# Patient Record
Sex: Male | Born: 1945 | Hispanic: No | Marital: Married | Smoking: Former smoker
Health system: Southern US, Community
[De-identification: ages and names within clinical notes are randomized; demographics above are authoritative.]

## PROBLEM LIST (undated history)

## (undated) DIAGNOSIS — I4891 Unspecified atrial fibrillation: Secondary | ICD-10-CM

---

## 2003-07-27 ENCOUNTER — Encounter: Payer: Self-pay | Admitting: Specialist

## 2003-08-02 ENCOUNTER — Observation Stay (HOSPITAL_COMMUNITY): Admission: RE | Admit: 2003-08-02 | Discharge: 2003-08-03 | Payer: Self-pay | Admitting: Specialist

## 2004-09-15 ENCOUNTER — Ambulatory Visit: Payer: Self-pay | Admitting: Cardiology

## 2004-09-18 ENCOUNTER — Ambulatory Visit: Payer: Self-pay

## 2004-11-21 ENCOUNTER — Ambulatory Visit: Payer: Self-pay | Admitting: Internal Medicine

## 2004-11-27 ENCOUNTER — Ambulatory Visit: Payer: Self-pay | Admitting: Cardiology

## 2007-06-17 DIAGNOSIS — I10 Essential (primary) hypertension: Secondary | ICD-10-CM

## 2007-06-17 DIAGNOSIS — M545 Low back pain, unspecified: Secondary | ICD-10-CM | POA: Insufficient documentation

## 2007-06-17 DIAGNOSIS — I4891 Unspecified atrial fibrillation: Secondary | ICD-10-CM

## 2007-06-17 DIAGNOSIS — Z8679 Personal history of other diseases of the circulatory system: Secondary | ICD-10-CM | POA: Insufficient documentation

## 2009-05-08 ENCOUNTER — Emergency Department (HOSPITAL_BASED_OUTPATIENT_CLINIC_OR_DEPARTMENT_OTHER): Admission: EM | Admit: 2009-05-08 | Discharge: 2009-05-08 | Payer: Self-pay | Admitting: Emergency Medicine

## 2009-05-08 ENCOUNTER — Ambulatory Visit: Payer: Self-pay | Admitting: Radiology

## 2011-03-20 NOTE — Op Note (Signed)
NAME:  Andre Friedman, Andre Friedman                           ACCOUNT NO.:  [id number]   MEDICAL RECORD NO.:  [id number]                   PATIENT TYPE:  AMB   LOCATION:  DAY                                  FACILITY:  [facility]   PHYSICIAN:  Kerrin Champagne, M.D.                DATE OF BIRTH:  Jan 17, 1946   DATE OF PROCEDURE:  08/02/2003  DATE OF DISCHARGE:                                 OPERATIVE REPORT   PREOPERATIVE DIAGNOSIS:  Left shoulder rotator cuff tear with retraction.   POSTOPERATIVE DIAGNOSIS:  Massive left shoulder rotator cuff tear with  retraction.   PROCEDURE:  Left shoulder acromioplasty with repair of left shoulder rotator  cuff tear; using an allograft fascia lata graft to the cuff, anchored  distally with four Mitek anchors.   SURGEON:  Kerrin Champagne, M.D.   ASSISTANT:  Wende Neighbors, P.A.   ANESTHESIA:  GOT by Hezzie Bump. Rose, M.D.   ESTIMATED BLOOD LOSS:  50 cc.   DRAINS:  None.   BRIEF CLINICAL NOTE:  This patient, a 65 year old male, who presented to  [facility] with the chief complaint of increased left arm weakness.  He has stated  that it is being worsened when he was trying to lift a bag of garbage and  throw it.  He states that this injury occurred in August 2004.  He has had  previous problems with the left shoulder, dating back to two years ago.  He  presented with significant left arm drop, weakness in abduction, external  rotation and internal rotation of the left shoulder.  His AC joint was  nontender.  Radiographs demonstrating narrowed left subacromial space with  degenerative change of the [facility] joint.  The patient underwent an MRI scan,  which demonstrated complete rotator cuff tear with some retraction present.  The patient's subscapularis apparently intact, as was his biceps tendon.  Because of persistent weakness, difficulty with overhead use of the arm, he  is taken to the operating room to undergo a left shoulder rotator cuff  repair.   INTRAOPERATIVE  FINDINGS:  A massive left shoulder rotator cuff tear,  involving the supraspinatus and infraspinatus muscles.  Despite attempts at  retraction, the infraspinatus and supraspinatus tendons could only be pulled  back only a centimeter or so distal to the edge of the labrum.  Therefore,  tendon graft was undertaken using fascia lata allograft material (6 cm x 16  cm).  This was folded onto itself four times and then used to graft the  rotator cuff.   DESCRIPTION OF PROCEDURE:  After adequate general anesthesia, the patient in  a beach chair position, standard prep done with Duraprep solution of left  wrist to the left axillary region, periscapular area and base of the neck  and anterior chest.  Draped in the usual manner.  Iodine Vy-Drape was used.   The incision was then approached  to the anterior aspect of the shoulder,  through an approximately 6 cm incision; initially a  3 or 4 cm incision, but this was extended once it was determined that a  graft procedure was necessary.  The incision over the acromion process  proximally, then in line with the anterior one-third raphe of the deltoid  muscle.  The skin and subcutaneous layers, the periosteum overlying the  acromion were then incised in line with the anterior raffae of the deltoid.  Then subperiosteal dissection carried both medially and laterally, exposing  the anterior aspect of the acromion process; there was a very large  hypertrophic spur here.  This was then divided using an oscillating saw.  A  high-speed bur then used to debride the undersurface of the acromion  process, decompressing the shoulder joint nicely.  Attempts were made to  completely decompress the lateral portion of the acromion, as this appeared  to be dipping downwards and causing further subacromial compression of the  humeral head.   A self-retaining retractor placed.  Careful inspection demonstrated that the  supraspinatus and infraspinatus tendons had  completely ruptured off of the  greater tuberosity.  There were only a few small strands remaining on the  greater tuberosity.  The remaining portion of the cuff was retracted, such  that with traction on the arm examination of the posterior aspect of the  shoulder joint demonstrated the edges of the supraspinatus and infraspinatus  tendons.  These were placed under traction using Kocher clamps, Allis clamps  and then sutures.  Sutures tended to tear away with attempts at prolonged  traction of this area.  The tendon material appeared to be poor in quality.  Biceps tendon was intact, as was the subscapularis anteriorly.   Next, 0 Ethibond sutures were used to tag the edges of the infraspinatus and  supraspinatus tendons, as they were localized from anterolateral, along the  posterior surface of the humeral head, over the top of the glenohumeral  joint; and then, sutures were also placed into the biceps tendon, then run  through the subscapularis muscle belly anteriorly and then brought through  the biceps tendon in multiple interrupted fashion.  A portion of allograft  tensor fascia lata graft then folded on itself multiple times, so that a  thickness of four thicknesses was obtained.  This was then sutured down to  the leading edges of the subscapularis and biceps anteriorly, and  supraspinatus and infraspinatus posteriorly; providing excellent fixation of  these areas.  The leading end was then cut at the level of the greater  tuberosity, and folded or embrocated underneath itself.  Mitek anchors were  then placed into the greater tuberosity; and the edge of the lateral aspect  of the chondral border of the humeral head and the sulcus were then  carefully burred using high-speed bur to bleeding cancellous bone.  Mitek  anchors were then used to suture the leading edge of the fascia lata graft  down, providing a nice suture repair of the leading edge of the graft to cancellous bone  surface.  Irrigation was then performed.  Range of motion  obtained of the shoulder, demonstrating no further impingement along the  graft or the rotator cuff itself.   A high-speed bur was used to further debride the undersurface of the  acromion process posteriorly, where a little bit of irregularity and  prominence of bone was found to be present.  When this was completed  irrigation was performed.  Then  the incision was closed by approximating the  periosteal layers of the deltoid over the superior aspect of the acromion  process, using 0 Vicryl sutures.  Reapproximating the superficial fascial  areas of the deltoid using interrupted 0 Vicryl sutures, deep subcutaneous  layers were approximated with interrupted 2-0 Vicryl sutures, and the skin  closed with a running subcuticular stitch of 4-0 Vicryl.  Tincture of  Benzoin and Steri-Strips applied.  Then 4x4's affixed to the skin with  Hypafix tape.  The skin was infiltrated then with Marcaine 0.5% with  1:200,000 epinephrine; and 6 cc of Marcaine with epinephrine injected into  the subacromial space.  The patient was then placed into a shoulder  immobilizer, an ABD anterior in the axillary region.  Then 4x4's and ABD  pads affixed to the skin with Hypafix tape over the left shoulder.   The patient was then reactivated, extubated and returned to the recovery  room in satisfactory condition.  All instrument and sponge counts were  correct.                                                Kerrin Champagne, M.D.   Myra Rude  D:  08/02/2003  T:  08/02/2003  Job:  [id number]

## 2015-10-13 ENCOUNTER — Emergency Department (HOSPITAL_COMMUNITY)
Admission: EM | Admit: 2015-10-13 | Discharge: 2015-10-13 | Disposition: A | Discharge status: Home / Self Care | Payer: 59 | Attending: Emergency Medicine | Admitting: Emergency Medicine

## 2015-10-13 ENCOUNTER — Encounter (HOSPITAL_COMMUNITY): Payer: Self-pay | Admitting: Emergency Medicine

## 2015-10-13 ENCOUNTER — Emergency Department (HOSPITAL_COMMUNITY): Payer: 59

## 2015-10-13 DIAGNOSIS — Z880 Allergy status to penicillin: Secondary | ICD-10-CM | POA: Diagnosis not present

## 2015-10-13 DIAGNOSIS — R079 Chest pain, unspecified: Secondary | ICD-10-CM | POA: Diagnosis present

## 2015-10-13 DIAGNOSIS — I480 Paroxysmal atrial fibrillation: Secondary | ICD-10-CM | POA: Insufficient documentation

## 2015-10-13 HISTORY — DX: Unspecified atrial fibrillation: I48.91

## 2015-10-13 LAB — TROPONIN I

## 2015-10-13 LAB — BASIC METABOLIC PANEL
ANION GAP: 4 — AB (ref 5–15)
BUN: 25 mg/dL — ABNORMAL HIGH (ref 6–20)
CALCIUM: 8.6 mg/dL — AB (ref 8.9–10.3)
CO2: 23 mmol/L (ref 22–32)
Chloride: 112 mmol/L — ABNORMAL HIGH (ref 101–111)
Creatinine, Ser: 1.21 mg/dL (ref 0.61–1.24)
GLUCOSE: 138 mg/dL — AB (ref 65–99)
POTASSIUM: 3.9 mmol/L (ref 3.5–5.1)
Sodium: 139 mmol/L (ref 135–145)

## 2015-10-13 LAB — CBC
HEMATOCRIT: 43.6 % (ref 39.0–52.0)
HEMOGLOBIN: 14.9 g/dL (ref 13.0–17.0)
MCH: 29.9 pg (ref 26.0–34.0)
MCHC: 34.2 g/dL (ref 30.0–36.0)
MCV: 87.6 fL (ref 78.0–100.0)
Platelets: 158 10*3/uL (ref 150–400)
RBC: 4.98 MIL/uL (ref 4.22–5.81)
RDW: 13.4 % (ref 11.5–15.5)
WBC: 10.1 10*3/uL (ref 4.0–10.5)

## 2015-10-13 NOTE — ED Provider Notes (Signed)
CSN: [id number]     Arrival date & time 10/13/15  1158 History   First MD Initiated Contact with Patient 10/13/15 1246     Chief Complaint  Patient presents with  . Chest Pain     Patient is a 69 y.o. male presenting with chest pain. The history is provided by the patient. No language interpreter was used.  Chest Pain  Andre Friedman is a 69 y.o. male who presents to the Emergency Department complaining of irregular heartbeat. He developed symptoms about one hour prior to ED arrival just after he ate breakfast. He developed a sensation of rapid heartbeat and irregular heartbeat. The rate was too high to count. He had some slight associated chest discomfort. He denies any fevers, vomiting, shortness of breath, abdominal pain, leg swelling or pain. On the time of ED evaluation his symptoms have completely resolved. He has a history of atrial fibrillation, last episode was a very long time ago. He also has a history of hypertension and takes metoprolol and lisinopril, no missed doses or recent changes to those doses. He is not on any anticoagulants.  Past Medical History  Diagnosis Date  . Atrial fibrillation (HCC)    History reviewed. No pertinent past surgical history. History reviewed. No pertinent family history. Social History  Substance Use Topics  . Smoking status: Never Smoker   . Smokeless tobacco: None  . Alcohol Use: No    Review of Systems  Cardiovascular: Positive for chest pain.  All other systems reviewed and are negative.     Allergies  Penicillins  Home Medications   Prior to Admission medications   Not on File   BP 144/106 mmHg  Pulse 125  Temp(Src) 98.2 F (36.8 C) (Oral)  Resp 18  SpO2 98% Physical Exam  Constitutional: He is oriented to person, place, and time. He appears well-developed and well-nourished.  HENT:  Head: Normocephalic and atraumatic.  Cardiovascular: Normal rate and regular rhythm.   No murmur heard. Pulmonary/Chest: Effort normal  and breath sounds normal. No respiratory distress.  Abdominal: Soft. There is no tenderness. There is no rebound and no guarding.  Musculoskeletal: He exhibits no edema or tenderness.  Neurological: He is alert and oriented to person, place, and time.  Skin: Skin is warm and dry.  Psychiatric: He has a normal mood and affect. His behavior is normal.  Nursing note and vitals reviewed.   ED Course  Procedures (including critical care time) Labs Review Labs Reviewed  BASIC METABOLIC PANEL - Abnormal; Notable for the following:    Chloride 112 (*)    Glucose, Bld 138 (*)    BUN 25 (*)    Calcium 8.6 (*)    Anion gap 4 (*)    All other components within normal limits  CBC  TROPONIN I    Imaging Review Dg Chest 2 View  10/13/2015  CLINICAL DATA:  Tachycardia.  History of atrial fibrillation EXAM: CHEST  2 VIEW COMPARISON:  July 29, 2012 FINDINGS: There is slight scarring in the left base. There is no edema or consolidation. Heart size and pulmonary vascularity are normal. No adenopathy. There is degenerative change in the thoracic spine. There is postoperative change in the left shoulder region. IMPRESSION: Mild scarring left base.  No edema or consolidation. Electronically Signed   By: Bretta BangWilliam  Woodruff III M.D.   On: 10/13/2015 14:12   I have personally reviewed and evaluated these images and lab results as part of my medical decision-making.  EKG Interpretation   Date/Time:  Sunday October 13 2015 12:05:20 EST Ventricular Rate:  113 PR Interval:    QRS Duration: 96 QT Interval:  341 QTC Calculation: 467 R Axis:   53 Text Interpretation:  Atrial fibrillation Paired ventricular premature  complexes Baseline wander in lead(s) V3 Confirmed by [name] [id number] on  10/13/2015 12:47:26 PM      MDM   Final diagnoses:  Paroxysmal atrial fibrillation (HCC)    Patient with history of atrial fibrillation here with symptomatic atrial fibrillation that resolved just  following ED arrival. Presentation is not consistent with ACS, PE, dissection. No significant electrolyte abnormality. Patient is currently on aspirin, no additional anticoagulants. Discussed with patient on careful paroxysmal atrial fibrillation, patient follow-up, return precautions.    Tilden Fossa, MD 10/13/15 1534

## 2015-10-13 NOTE — Discharge Instructions (Signed)

## 2015-10-13 NOTE — ED Notes (Signed)
Pt given d/c instructions, verbalized understanding. 

## 2015-10-13 NOTE — ED Notes (Signed)
Pt with Hx of atrial fibrillation c/o epigastric pain, rapid heart rate. Pt has atrial fibrillation which is controlled with medication, hasn't had a-fib episode in years.

## 2018-09-15 ENCOUNTER — Encounter: Payer: Self-pay | Admitting: Pulmonary Disease

## 2018-09-15 ENCOUNTER — Ambulatory Visit: Payer: Medicare Other | Admitting: Pulmonary Disease

## 2018-09-15 DIAGNOSIS — R0602 Shortness of breath: Secondary | ICD-10-CM | POA: Diagnosis not present

## 2018-09-15 NOTE — Progress Notes (Signed)
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 aspirin EC 81 MG tablet Take 162 mg by mouth at bedtime.  Marland Kitchen. atorvastatin (LIPITOR) 40 MG tablet Take 40 mg by mouth at bedtime.   . AZOPT 1 % ophthalmic suspension Place 1 drop into both eyes 2 (two) times daily.   . flecainide (TAMBOCOR) 50 MG tablet Take by mouth.  . metoprolol succinate (TOPROL-XL) 25 MG 24 hr tablet Take 25 mg by mouth at bedtime.   . ramipril (ALTACE) 10 MG capsule Take 10 mg by mouth at bedtime.   . TRAVATAN Z 0.004 % SOLN ophthalmic solution Place 1 drop into both eyes at bedtime.    No facility-administered encounter medications on file as of 09/15/2018.     Allergies as of 09/15/2018 - Review Complete 09/15/2018  Allergen Reaction Noted  . Penicillins Hives 06/17/2007     Past Medical History:  Diagnosis Date  . Atrial fibrillation (HCC)     No past surgical history on file.  No family history on file.  Social History   Socioeconomic History  . Marital status: Married    Spouse name: Not on file  . Number of children: Not on file  . Years of education: Not on file  . Highest education level: Not on file  Occupational History  . Not on file  Social Needs  . Financial resource strain: Not on file  . Food insecurity:    Worry: Not on file    Inability: Not on file  . Transportation needs:    Medical: Not on file    Non-medical: Not on file  Tobacco Use  . Smoking status: Former Smoker    Packs/day: 0.50    Types: Cigarettes    Start date: 05/13/1987    Last attempt to quit: 05/18/2000    Years since quitting: 18.3  . Smokeless tobacco: Never Used  Substance and Sexual Activity  . Alcohol use: No  . Drug use: No  . Sexual activity: Not on file  Lifestyle  . Physical activity:    Days per week: Not on file    Minutes per session: Not on file  . Stress: Not on file  Relationships  . Social connections:    Talks on phone: Not on file  Gets together: Not on file    Attends religious service: Not on file    Active member of club or organization: Not on file    Attends meetings of clubs or organizations: Not on file    Relationship status: Not on file  . Intimate partner violence:    Fear of current or ex partner: Not on file    Emotionally abused: Not on file    Physically abused: Not on file    Forced sexual activity: Not on file  Other Topics Concern  . Not on file  Social History Narrative  . Not on file    Review of Systems  Constitutional: Negative.   HENT: Negative.   Eyes: Negative.   Respiratory: Positive for cough and shortness of breath.   Cardiovascular: Negative for chest pain and leg swelling.  Endocrine: Negative.     Vitals:   09/15/18 0950  BP: (!) 140/98  Pulse: 66  SpO2: 100%     Physical  Exam  Constitutional: He is oriented to person, place, and time. He appears well-developed and well-nourished.  HENT:  Head: Normocephalic and atraumatic.  Eyes: Pupils are equal, round, and reactive to light. Right eye exhibits no discharge. Left eye exhibits no discharge.  Neck: Normal range of motion. Neck supple. No tracheal deviation present. No thyromegaly present.  Cardiovascular: Normal rate and regular rhythm.  Pulmonary/Chest: Effort normal and breath sounds normal. No respiratory distress. He has no wheezes. He has no rales.  Abdominal: Soft. Bowel sounds are normal. He exhibits no distension. There is no tenderness.  Musculoskeletal: Normal range of motion. He exhibits no edema or deformity.  Neurological: He is alert and oriented to person, place, and time.  Skin: Skin is warm and dry.  Psychiatric: He has a normal mood and affect.   Data Reviewed: Chest x-ray from 10/13/2015-mild scarring at the left base  Assessment:   History of significant exposure to asbestos in the past  1-1/2 years of work and asbestos directly without protective devices  Abnormal chest x-ray showing scarring at the left base  Shortness of breath with activity-this may be from multifactorial  Reformed smoker  History of atrial fibrillation  Plan/Recommendations:  We will order a CT scan of the chest without contrast  We will get a full pulmonary function study  I will see him back in the office in about 4 weeks  Questions answered, encouraged to call if any significant concerns  Virl Diamond MD [facility] 09/15/2018, 9:53 AM  CC: Eliott Nine,*

## 2018-09-15 NOTE — Patient Instructions (Signed)
History of asbestos exposure Abnormal chest x-ray in 2016 showing scarring at the bases of the lungs Shortness of breath with activity  We will obtain a CT scan of the chest to look at the lung structure A pulmonary function study to assess lung function at rest  See you back in the office in about 4 weeks  Call with any significant concerns or questions

## 2018-09-20 ENCOUNTER — Ambulatory Visit (INDEPENDENT_AMBULATORY_CARE_PROVIDER_SITE_OTHER): Payer: Medicare Other | Admitting: Pulmonary Disease

## 2018-09-20 DIAGNOSIS — R0602 Shortness of breath: Secondary | ICD-10-CM

## 2018-09-20 LAB — PULMONARY FUNCTION TEST
DL/VA % pred: 81 %
DL/VA: 3.6 ml/min/mmHg/L
DLCO UNC: 20.79 ml/min/mmHg
DLCO unc % pred: 73 %
FEF 25-75 POST: 3.22 L/s
FEF 25-75 Pre: 2.92 L/sec
FEF2575-%Change-Post: 10 %
FEF2575-%Pred-Post: 146 %
FEF2575-%Pred-Pre: 132 %
FEV1-%CHANGE-POST: 0 %
FEV1-%PRED-POST: 113 %
FEV1-%PRED-PRE: 113 %
FEV1-PRE: 3.26 L
FEV1-Post: 3.28 L
FEV1FVC-%Change-Post: 1 %
FEV1FVC-%PRED-PRE: 106 %
FEV6-%Change-Post: -1 %
FEV6-%PRED-POST: 110 %
FEV6-%PRED-PRE: 112 %
FEV6-Post: 4.08 L
FEV6-Pre: 4.14 L
FEV6FVC-%CHANGE-POST: 0 %
FEV6FVC-%PRED-POST: 105 %
FEV6FVC-%PRED-PRE: 106 %
FVC-%Change-Post: 0 %
FVC-%Pred-Post: 104 %
FVC-%Pred-Pre: 105 %
FVC-Post: 4.11 L
FVC-Pre: 4.14 L
POST FEV6/FVC RATIO: 99 %
PRE FEV6/FVC RATIO: 100 %
Post FEV1/FVC ratio: 80 %
Pre FEV1/FVC ratio: 79 %
RV % PRED: 105 %
RV: 2.4 L
TLC % PRED: 106 %
TLC: 6.83 L

## 2018-09-20 NOTE — Progress Notes (Signed)
PFT done today. 

## 2018-09-21 ENCOUNTER — Ambulatory Visit (INDEPENDENT_AMBULATORY_CARE_PROVIDER_SITE_OTHER)
Admission: RE | Admit: 2018-09-21 | Discharge: 2018-09-21 | Disposition: A | Discharge status: Home / Self Care | Payer: Medicare Other | Source: Ambulatory Visit | Attending: Pulmonary Disease | Admitting: Pulmonary Disease

## 2018-09-21 DIAGNOSIS — R0602 Shortness of breath: Secondary | ICD-10-CM | POA: Diagnosis not present

## 2018-09-27 ENCOUNTER — Institutional Professional Consult (permissible substitution): Payer: Medicare Other | Admitting: Pulmonary Disease

## 2018-10-06 ENCOUNTER — Ambulatory Visit: Payer: Medicare Other | Admitting: Pulmonary Disease

## 2018-10-06 ENCOUNTER — Ambulatory Visit (INDEPENDENT_AMBULATORY_CARE_PROVIDER_SITE_OTHER): Payer: Medicare Other | Admitting: Pulmonary Disease

## 2018-10-06 ENCOUNTER — Encounter: Payer: Self-pay | Admitting: Pulmonary Disease

## 2018-10-06 VITALS — BP 134/72 | HR 68 | Ht 66.5 in | Wt 158.0 lb

## 2018-10-06 DIAGNOSIS — R0602 Shortness of breath: Secondary | ICD-10-CM | POA: Diagnosis not present

## 2018-10-06 NOTE — Patient Instructions (Signed)
No significant abnormality on your CT scan of the chest Your pulmonary function study/breathing study was within normal limits  Your CT is not showing any signs of scarring at the base of the lung  No changes required at present  I will see you in the office as needed You can call with any significant concerns

## 2018-10-06 NOTE — Progress Notes (Signed)
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 aspirin EC 81 MG tablet Take 162 mg by mouth at bedtime.  Marland Kitchen. atorvastatin (LIPITOR) 40 MG tablet Take 40 mg by mouth at bedtime.   . AZOPT 1 % ophthalmic suspension Place 1 drop into both eyes 2 (two) times daily.   . flecainide (TAMBOCOR) 50 MG tablet Take by mouth.  . metoprolol succinate (TOPROL-XL) 25 MG 24 hr tablet Take 25 mg by mouth at bedtime.   . ramipril (ALTACE) 10 MG capsule Take 10 mg by mouth at bedtime.   . TRAVATAN Z 0.004 % SOLN ophthalmic solution Place 1 drop into both eyes at bedtime.    No facility-administered encounter medications on file as of 10/06/2018.     Allergies as of 10/06/2018 - Review Complete 10/06/2018  Allergen Reaction Noted  . Penicillins Hives 06/17/2007    Past Medical History:  Diagnosis Date  .  Atrial fibrillation (HCC)     No past surgical history on file.  No family history on file.  Social History   Socioeconomic History  . Marital status: Married    Spouse name: Not on file  . Number of children: Not on file  . Years of education: Not on file  . Highest education level: Not on file  Occupational History  . Not on file  Social Needs  . Financial resource strain: Not on file  . Food insecurity:    Worry: Not on file    Inability: Not on file  . Transportation needs:    Medical: Not on file    Non-medical: Not on file  Tobacco Use  . Smoking status: Former Smoker    Packs/day: 0.50    Types: Cigarettes    Start date: 05/13/1987    Last attempt to quit: 05/18/2000    Years since quitting: 18.3  . Smokeless tobacco: Never Used  Substance and Sexual Activity  . Alcohol use: No  . Drug use: No  . Sexual activity: Not on file  Lifestyle  . Physical activity:    Days per week: Not on file    Minutes per session: Not on file  . Stress: Not on file  Relationships  . Social connections:    Talks on phone: Not on file    Gets together: Not  on file    Attends religious service: Not on file    Active member of club or organization: Not on file    Attends meetings of clubs or organizations: Not on file    Relationship status: Not on file  . Intimate partner violence:    Fear of current or ex partner: Not on file    Emotionally abused: Not on file    Physically abused: Not on file    Forced sexual activity: Not on file  Other Topics Concern  . Not on file  Social History Narrative  . Not on file    Review of Systems  Constitutional: Negative.   HENT: Negative.   Eyes: Negative.   Respiratory: Positive for shortness of breath. Negative for cough.   Cardiovascular: Negative for chest pain and leg swelling.  All other systems reviewed and are negative.   Vitals:   10/06/18 1516  BP: 134/72  Pulse: 68  SpO2: 100%     Physical Exam  Constitutional:  He appears well-developed and well-nourished.  HENT:  Head: Normocephalic and atraumatic.  Eyes: Pupils are equal, round, and reactive to light. Right eye exhibits no discharge. Left eye exhibits no discharge.  Neck: Normal range of motion. Neck supple. No tracheal deviation present. No thyromegaly present.  Cardiovascular: Normal rate and regular rhythm.  Pulmonary/Chest: Effort normal and breath sounds normal. No respiratory distress. He has no wheezes. He has no rales.  Abdominal: Soft. Bowel sounds are normal. He exhibits no distension. There is no tenderness.   Data Reviewed: Chest x-ray from 10/13/2015-mild scarring at the left base  CT scan reviewed with the patient showing no significant abnormality, has a couple of blebs in his lungs No scarring at the base of the lungs  Merry function studies within normal limits  Assessment:   History of significant exposure to asbestos in the past  1-1/2 years of work and asbestos directly without protective devices  CT showing no significant abnormality -There is no evidence suggesting asbestosis or any significant pleural plaquing  Shortness of breath with activity-not limiting  Reformed smoker  History of atrial fibrillation  Plan/Recommendations:   I will see him in the office as needed  Continue physical activity as tolerated  Questions answered, encouraged to call if any significant concerns  Virl Diamond MD [facility] 10/06/2018, 3:26 PM  CC: Eliott Nine,*

## 2020-01-24 ENCOUNTER — Ambulatory Visit (INDEPENDENT_AMBULATORY_CARE_PROVIDER_SITE_OTHER): Payer: Medicare Other | Admitting: Otolaryngology

## 2020-02-01 ENCOUNTER — Other Ambulatory Visit: Payer: Self-pay

## 2020-02-01 ENCOUNTER — Encounter (INDEPENDENT_AMBULATORY_CARE_PROVIDER_SITE_OTHER): Payer: Self-pay | Admitting: Otolaryngology

## 2020-02-01 ENCOUNTER — Ambulatory Visit (INDEPENDENT_AMBULATORY_CARE_PROVIDER_SITE_OTHER): Payer: Medicare Other | Admitting: Otolaryngology

## 2020-02-01 VITALS — Temp 98.1°F

## 2020-02-01 DIAGNOSIS — H6123 Impacted cerumen, bilateral: Secondary | ICD-10-CM | POA: Diagnosis not present

## 2020-02-01 NOTE — Progress Notes (Signed)
HPI: Andre Friedman is a 74 y.o. male who presents for evaluation of to have his ears cleaned.  The left side is generally worse than the right..  Past Medical History:  Diagnosis Date  . Atrial fibrillation ([place])    No past surgical history on file. Social History   Socioeconomic History  . Marital status: Married    Spouse name: Not on file  . Number of children: Not on file  . Years of education: Not on file  . Highest education level: Not on file  Occupational History  . Not on file  Tobacco Use  . Smoking status: Former Smoker    Packs/day: 0.50    Years: 13.00    Pack years: 6.50    Types: Cigarettes    Start date: 05/13/1987    Quit date: 05/18/2000    Years since quitting: 19.7  . Smokeless tobacco: Never Used  Substance and Sexual Activity  . Alcohol use: No  . Drug use: No  . Sexual activity: Not on file  Other Topics Concern  . Not on file  Social History Narrative  . Not on file   Social Determinants of Health   Financial Resource Strain:   . Difficulty of Paying Living Expenses:   Food Insecurity:   . Worried About Charity fundraiser in the Last Year:   . Arboriculturist in the Last Year:   Transportation Needs:   . Film/video editor (Medical):   Marland Kitchen Lack of Transportation (Non-Medical):   Physical Activity:   . Days of Exercise per Week:   . Minutes of Exercise per Session:   Stress:   . Feeling of Stress :   Social Connections:   . Frequency of Communication with Friends and Family:   . Frequency of Social Gatherings with Friends and Family:   . Attends Religious Services:   . Active Member of Clubs or Organizations:   . Attends Archivist Meetings:   Marland Kitchen Marital Status:    No family history on file. Allergies  Allergen Reactions  . Penicillins Hives    Has patient had a PCN reaction causing immediate rash, facial/tongue/throat swelling, SOB or lightheadedness with hypotension: YES Has patient had a PCN reaction causing severe  rash involving mucus membranes or skin necrosis: YES Has patient had a PCN reaction that required hospitalization NO Has patient had a PCN reaction occurring within the last 10 years: NO If all of the above answers are "NO", then may proceed with Cephalosporin use.    Prior to Admission medications   Medication Sig Start Date End Date Taking? Authorizing Provider  aspirin EC 81 MG tablet Take 162 mg by mouth at bedtime.   Yes [provider]  atorvastatin (LIPITOR) 40 MG tablet Take 40 mg by mouth at bedtime.  09/24/15  Yes [provider]  AZOPT 1 % ophthalmic suspension Place 1 drop into both eyes 2 (two) times daily.  08/25/15  Yes [provider]  metoprolol succinate (TOPROL-XL) 25 MG 24 hr tablet Take 25 mg by mouth at bedtime.  09/24/15  Yes [provider]  ramipril (ALTACE) 10 MG capsule Take 10 mg by mouth at bedtime.  09/24/15  Yes [provider]  TRAVATAN Z 0.004 % SOLN ophthalmic solution Place 1 drop into both eyes at bedtime.  08/25/15  Yes [provider]  flecainide (TAMBOCOR) 50 MG tablet Take by mouth. 02/18/17 02/22/19  [provider]     Positive  ROS: Otherwise negative  All other systems have been reviewed and were otherwise negative with the exception of those mentioned in the HPI and as above.  Physical Exam: Constitutional: Alert, well-appearing, no acute distress Ears: External ears without lesions or tenderness. Ear canals he has narrow ear canals bilaterally.  Left ear canal was cleaned with suction and curette right ear canal was cleaned with curettes only.  Ear canals and TMs are otherwise clear.. Nasal: External nose without lesions. Clear nasal passages Oral: Oropharynx clear. Neck: No palpable adenopathy or masses Respiratory: Breathing comfortably  Skin: No facial/neck lesions or rash noted.  Cerumen impaction removal  Date/Time: 02/01/2020 3:11 PM Performed by: Drema Halon,  MD Authorized by: Drema Halon, MD   Consent:    Consent obtained:  Verbal   Consent given by:  Patient   Risks discussed:  Pain and bleeding Procedure details:    Location:  L ear and R ear   Procedure type: curette and suction   Post-procedure details:    Inspection:  TM intact and canal normal   Hearing quality:  Improved   Patient tolerance of procedure:  Tolerated well, no immediate complications Comments:     TMs are clear bilaterally    Assessment: Bilateral cerumen impactions  Plan: Ear canals were cleaned in the office.  He will follow-up as needed  Narda Bonds, MD

## 2020-06-29 ENCOUNTER — Inpatient Hospital Stay (HOSPITAL_COMMUNITY)
Admission: EM | Admit: 2020-06-29 | Discharge: 2020-07-05 | DRG: 177 | Disposition: A | Discharge status: Home / Self Care | Payer: Medicare Other | Attending: Internal Medicine | Admitting: Internal Medicine

## 2020-06-29 ENCOUNTER — Emergency Department (HOSPITAL_COMMUNITY): Payer: Medicare Other

## 2020-06-29 ENCOUNTER — Encounter (HOSPITAL_COMMUNITY): Payer: Self-pay

## 2020-06-29 DIAGNOSIS — E1165 Type 2 diabetes mellitus with hyperglycemia: Secondary | ICD-10-CM | POA: Diagnosis not present

## 2020-06-29 DIAGNOSIS — J9600 Acute respiratory failure, unspecified whether with hypoxia or hypercapnia: Secondary | ICD-10-CM | POA: Diagnosis not present

## 2020-06-29 DIAGNOSIS — Z880 Allergy status to penicillin: Secondary | ICD-10-CM

## 2020-06-29 DIAGNOSIS — I480 Paroxysmal atrial fibrillation: Secondary | ICD-10-CM | POA: Diagnosis present

## 2020-06-29 DIAGNOSIS — U071 COVID-19: Secondary | ICD-10-CM | POA: Diagnosis not present

## 2020-06-29 DIAGNOSIS — Z7984 Long term (current) use of oral hypoglycemic drugs: Secondary | ICD-10-CM

## 2020-06-29 DIAGNOSIS — R7401 Elevation of levels of liver transaminase levels: Secondary | ICD-10-CM | POA: Diagnosis present

## 2020-06-29 DIAGNOSIS — N179 Acute kidney failure, unspecified: Secondary | ICD-10-CM | POA: Diagnosis present

## 2020-06-29 DIAGNOSIS — R0602 Shortness of breath: Secondary | ICD-10-CM

## 2020-06-29 DIAGNOSIS — K5900 Constipation, unspecified: Secondary | ICD-10-CM | POA: Diagnosis present

## 2020-06-29 DIAGNOSIS — Z283 Underimmunization status: Secondary | ICD-10-CM

## 2020-06-29 DIAGNOSIS — Z8546 Personal history of malignant neoplasm of prostate: Secondary | ICD-10-CM

## 2020-06-29 DIAGNOSIS — R55 Syncope and collapse: Secondary | ICD-10-CM | POA: Diagnosis present

## 2020-06-29 DIAGNOSIS — Z8679 Personal history of other diseases of the circulatory system: Secondary | ICD-10-CM | POA: Diagnosis present

## 2020-06-29 DIAGNOSIS — Z7982 Long term (current) use of aspirin: Secondary | ICD-10-CM

## 2020-06-29 DIAGNOSIS — Z7901 Long term (current) use of anticoagulants: Secondary | ICD-10-CM

## 2020-06-29 DIAGNOSIS — T380X5A Adverse effect of glucocorticoids and synthetic analogues, initial encounter: Secondary | ICD-10-CM | POA: Diagnosis not present

## 2020-06-29 DIAGNOSIS — Z87891 Personal history of nicotine dependence: Secondary | ICD-10-CM

## 2020-06-29 DIAGNOSIS — H409 Unspecified glaucoma: Secondary | ICD-10-CM | POA: Diagnosis present

## 2020-06-29 DIAGNOSIS — I4891 Unspecified atrial fibrillation: Secondary | ICD-10-CM | POA: Diagnosis present

## 2020-06-29 DIAGNOSIS — Z79899 Other long term (current) drug therapy: Secondary | ICD-10-CM

## 2020-06-29 DIAGNOSIS — J1282 Pneumonia due to coronavirus disease 2019: Secondary | ICD-10-CM | POA: Diagnosis present

## 2020-06-29 DIAGNOSIS — E871 Hypo-osmolality and hyponatremia: Secondary | ICD-10-CM | POA: Diagnosis present

## 2020-06-29 DIAGNOSIS — J9601 Acute respiratory failure with hypoxia: Secondary | ICD-10-CM | POA: Diagnosis present

## 2020-06-29 DIAGNOSIS — I10 Essential (primary) hypertension: Secondary | ICD-10-CM | POA: Diagnosis present

## 2020-06-29 LAB — COMPREHENSIVE METABOLIC PANEL
ALT: 102 U/L — ABNORMAL HIGH (ref 0–44)
AST: 149 U/L — ABNORMAL HIGH (ref 15–41)
Albumin: 2.5 g/dL — ABNORMAL LOW (ref 3.5–5.0)
Alkaline Phosphatase: 53 U/L (ref 38–126)
Anion gap: 11 (ref 5–15)
BUN: 28 mg/dL — ABNORMAL HIGH (ref 8–23)
CO2: 20 mmol/L — ABNORMAL LOW (ref 22–32)
Calcium: 8 mg/dL — ABNORMAL LOW (ref 8.9–10.3)
Chloride: 102 mmol/L (ref 98–111)
Creatinine, Ser: 1.41 mg/dL — ABNORMAL HIGH (ref 0.61–1.24)
GFR calc Af Amer: 58 mL/min — ABNORMAL LOW (ref >60)
GFR calc non Af Amer: 50 mL/min — ABNORMAL LOW (ref >60)
Glucose, Bld: 190 mg/dL — ABNORMAL HIGH (ref 70–99)
Potassium: 4 mmol/L (ref 3.5–5.1)
Sodium: 133 mmol/L — ABNORMAL LOW (ref 135–145)
Total Bilirubin: 1.1 mg/dL (ref 0.3–1.2)
Total Protein: 5.9 g/dL — ABNORMAL LOW (ref 6.5–8.1)

## 2020-06-29 LAB — CBC WITH DIFFERENTIAL/PLATELET
Abs Immature Granulocytes: 0.07 10*3/uL (ref 0.00–0.07)
Basophils Absolute: 0 10*3/uL (ref 0.0–0.1)
Basophils Relative: 0 %
Eosinophils Absolute: 0 10*3/uL (ref 0.0–0.5)
Eosinophils Relative: 0 %
HCT: 39.9 % (ref 39.0–52.0)
Hemoglobin: 13.1 g/dL (ref 13.0–17.0)
Immature Granulocytes: 1 %
Lymphocytes Relative: 7 %
Lymphs Abs: 0.5 10*3/uL — ABNORMAL LOW (ref 0.7–4.0)
MCH: 29.4 pg (ref 26.0–34.0)
MCHC: 32.8 g/dL (ref 30.0–36.0)
MCV: 89.5 fL (ref 80.0–100.0)
Monocytes Absolute: 0.4 10*3/uL (ref 0.1–1.0)
Monocytes Relative: 5 %
Neutro Abs: 6.5 10*3/uL (ref 1.7–7.7)
Neutrophils Relative %: 87 %
Platelets: 117 10*3/uL — ABNORMAL LOW (ref 150–400)
RBC: 4.46 MIL/uL (ref 4.22–5.81)
RDW: 13.8 % (ref 11.5–15.5)
WBC: 7.5 10*3/uL (ref 4.0–10.5)
nRBC: 0 % (ref 0.0–0.2)

## 2020-06-29 LAB — D-DIMER, QUANTITATIVE: D-Dimer, Quant: 3.38 ug/mL-FEU — ABNORMAL HIGH (ref 0.00–0.50)

## 2020-06-29 LAB — PROCALCITONIN: Procalcitonin: 0.28 ng/mL

## 2020-06-29 LAB — TRIGLYCERIDES: Triglycerides: 133 mg/dL (ref <150)

## 2020-06-29 LAB — LACTATE DEHYDROGENASE: LDH: 472 U/L — ABNORMAL HIGH (ref 98–192)

## 2020-06-29 LAB — FIBRINOGEN: Fibrinogen: 686 mg/dL — ABNORMAL HIGH (ref 210–475)

## 2020-06-29 LAB — LACTIC ACID, PLASMA
Lactic Acid, Venous: 1.7 mmol/L (ref 0.5–1.9)
Lactic Acid, Venous: 1.4 mmol/L (ref 0.5–1.9)

## 2020-06-29 LAB — C-REACTIVE PROTEIN: CRP: 12.4 mg/dL — ABNORMAL HIGH (ref <1.0)

## 2020-06-29 LAB — FERRITIN: Ferritin: 3472 ng/mL — ABNORMAL HIGH (ref 24–336)

## 2020-06-29 MED ORDER — DEXAMETHASONE SODIUM PHOSPHATE 10 MG/ML IJ SOLN
6.0000 mg | Freq: Once | INTRAMUSCULAR | Status: AC
  Administered 2020-06-29: 6 mg via INTRAVENOUS
  Filled 2020-06-29: qty 1

## 2020-06-29 MED ORDER — SODIUM CHLORIDE 0.9 % IV SOLN
2.0000 g | Freq: Once | INTRAVENOUS | Status: AC
  Administered 2020-06-29: 2 g via INTRAVENOUS
  Filled 2020-06-29: qty 20

## 2020-06-29 MED ORDER — LACTATED RINGERS IV BOLUS
500.0000 mL | Freq: Once | INTRAVENOUS | Status: AC
  Administered 2020-06-29: 500 mL via INTRAVENOUS

## 2020-06-29 MED ORDER — SODIUM CHLORIDE 0.9 % IV SOLN
500.0000 mg | Freq: Once | INTRAVENOUS | Status: AC
  Administered 2020-06-29: 500 mg via INTRAVENOUS
  Filled 2020-06-29: qty 500

## 2020-06-29 MED ORDER — SODIUM CHLORIDE 0.9 % IV SOLN
100.0000 mg | Freq: Every day | INTRAVENOUS | Status: AC
  Administered 2020-06-30 – 2020-07-03 (×4): 100 mg via INTRAVENOUS
  Filled 2020-06-29: qty 20
  Filled 2020-06-29: qty 100
  Filled 2020-06-29 (×2): qty 20

## 2020-06-29 MED ORDER — IOHEXOL 350 MG/ML SOLN
100.0000 mL | Freq: Once | INTRAVENOUS | Status: AC | PRN
  Administered 2020-06-29: 100 mL via INTRAVENOUS

## 2020-06-29 MED ORDER — SODIUM CHLORIDE 0.9 % IV SOLN
200.0000 mg | Freq: Once | INTRAVENOUS | Status: AC
  Administered 2020-06-29: 200 mg via INTRAVENOUS
  Filled 2020-06-29: qty 40

## 2020-06-29 NOTE — H&P (Signed)
PCP:   Drosinis, Leonia Reader, PA-C   Chief Complaint:  Cough  HPI: This is a 74 year old male who was sent in by his daughter wife.  Per patient and his wife he was diagnosed with Covid approximately 2 weeks ago in [place].  His wife has been more ill than he has been.  She spent time in the hospital was discharged home 2 days ago.  On returning home need to his wife and his daughter later he looked.  He has a persistent irritable dry cough.  He has decreased p.o. intake and has been getting progressively weaker.  Now walking in small shuffling type steps.  To me he denies any shortness of breath or wheezing but to the ER physician he endorsed shortness of breath.  He states he has not been eating. .  Both he and his wife are unvaccinated.  Review of Systems:  The patient denies anorexia, fever, weight loss,, vision loss, decreased hearing, hoarseness, chest pain, syncope, dyspnea on exertion, peripheral edema, balance deficits, hemoptysis, abdominal pain, melena, hematochezia, severe indigestion/heartburn, hematuria, incontinence, genital sores, muscle weakness, suspicious skin lesions, transient blindness, difficulty walking, depression, unusual weight change, abnormal bleeding, enlarged lymph nodes, angioedema, and breast masses. Positive: anorexia, cough, headaches  Past Medical History: Past Medical History:  Diagnosis Date  . Atrial fibrillation (HCC)    History reviewed. No pertinent surgical history.  Medications: Prior to Admission medications   Medication Sig Start Date End Date Taking? Authorizing Provider  aspirin EC 81 MG tablet Take 162 mg by mouth at bedtime.   Yes [provider]  atorvastatin (LIPITOR) 40 MG tablet Take 40 mg by mouth at bedtime.  09/24/15  Yes [provider]  carvedilol (COREG) 6.25 MG tablet Take 6.25 mg by mouth 2 (two) times daily. 05/25/20  Yes [provider]  dorzolamide (TRUSOPT) 2 % ophthalmic solution Place 1 drop into both  eyes 2 (two) times daily. 04/28/20  Yes [provider]  ezetimibe (ZETIA) 10 MG tablet Take 10 mg by mouth daily. 05/02/20  Yes [provider]  flecainide (TAMBOCOR) 50 MG tablet Take 50 mg by mouth 2 (two) times daily.  02/18/17  Yes [provider]  glipiZIDE (GLUCOTROL XL) 5 MG 24 hr tablet Take 5 mg by mouth daily. 05/02/20  Yes [provider]  latanoprost (XALATAN) 0.005 % ophthalmic solution Place 1 drop into both eyes at bedtime. 01/15/20  Yes [provider]  metFORMIN (GLUCOPHAGE) 500 MG tablet Take 250 mg by mouth daily. 04/12/20  Yes [provider]  ramipril (ALTACE) 10 MG capsule Take 10 mg by mouth at bedtime.  09/24/15  Yes [provider]    Allergies:   Allergies  Allergen Reactions  . Penicillins Hives    Has patient had a PCN reaction causing immediate rash, facial/tongue/throat swelling, SOB or lightheadedness with hypotension: YES Has patient had a PCN reaction causing severe rash involving mucus membranes or skin necrosis: YES Has patient had a PCN reaction that required hospitalization NO Has patient had a PCN reaction occurring within the last 10 years: NO If all of the above answers are "NO", then may proceed with Cephalosporin use.     Social History:  reports that he quit smoking about 20 years ago. His smoking use included cigarettes. He started smoking about 33 years ago. He has a 6.50 pack-year smoking history. He has never used smokeless tobacco. He reports that he does not drink alcohol and does not use drugs.  Family History: History reviewed. No pertinent family history.  Physical Exam: Vitals:   06/29/20 1810 06/29/20 1813 06/29/20 1827 06/29/20 2015  BP:    116/74  Pulse:    78  Resp:    16  Temp:      TempSrc:      SpO2: (S) 90% 93% 95% 98%  Weight:      Height:        General:  Alert and oriented times three, well developed and nourished, no acute distress, weak looking Eyes:  PERRLA, pink conjunctiva, no scleral icterus ENT: Moist oral mucosa, neck supple, no thyromegaly Lungs: clear to ascultation, no wheeze, mild crackles, no use of accessory muscles Cardiovascular: regular rate and rhythm, no regurgitation, no gallops, no murmurs. No carotid bruits, no JVD Abdomen: soft, positive BS, non-tender, non-distended, no organomegaly, not an acute abdomen GU: not examined Neuro: CN II - XII grossly intact, sensation intact Musculoskeletal: strength 5/5 all extremities, no clubbing, cyanosis or edema Skin: no rash, no subcutaneous crepitation, no decubitus Psych: appropriate patient   Labs on Admission:  Recent Labs    06/29/20 1617  NA 133*  K 4.0  CL 102  CO2 20*  GLUCOSE 190*  BUN 28*  CREATININE 1.41*  CALCIUM 8.0*   Recent Labs    06/29/20 1617  AST 149*  ALT 102*  ALKPHOS 53  BILITOT 1.1  PROT 5.9*  ALBUMIN 2.5*   No results for input(s): LIPASE, AMYLASE in the last 72 hours. Recent Labs    06/29/20 1835  WBC 7.5  NEUTROABS 6.5  HGB 13.1  HCT 39.9  MCV 89.5  PLT 117*   No results for input(s): CKTOTAL, CKMB, CKMBINDEX, TROPONINI in the last 72 hours. Invalid input(s): POCBNP Recent Labs    06/29/20 1617  DDIMER 3.38*   No results for input(s): HGBA1C in the last 72 hours. Recent Labs    06/29/20 1617  TRIG 133   No results for input(s): TSH, T4TOTAL, T3FREE, THYROIDAB in the last 72 hours.  Invalid input(s): FREET3 Recent Labs    06/29/20 1617  FERRITIN 3,472*    Micro Results: No results found for this or any previous visit (from the past 240 hour(s)).   Radiological Exams on Admission: CT Angio Chest PE W and/or Wo Contrast  Result Date: 06/29/2020 CLINICAL DATA:  74 year old male with concern for pulmonary embolism. EXAM: CT ANGIOGRAPHY CHEST WITH CONTRAST TECHNIQUE: Multidetector CT imaging of the chest was performed using the standard protocol during bolus administration of intravenous contrast. Multiplanar  CT image reconstructions and MIPs were obtained to evaluate the vascular anatomy. CONTRAST:  OMNIPAQUE IOHEXOL 350 MG/ML SOLN COMPARISON:  Chest radiograph dated 06/29/2020 and CT dated 09/21/2018. FINDINGS: Cardiovascular: There is no cardiomegaly or pericardial effusion. Coronary vascular calcification of the LAD and RCA. Mild atherosclerotic calcification of the thoracic aorta. No aneurysmal dilatation or dissection. Evaluation of the pulmonary arteries is limited due to respiratory motion artifact. No pulmonary artery embolus identified. Mediastinum/Nodes: Mildly enlarged subcarinal lymph node measures 16 mm short axis. Top-normal bilateral hilar lymph nodes, likely reactive. The esophagus and the thyroid gland are grossly unremarkable. No mediastinal fluid collection. Lungs/Pleura: Bilateral patchy ground-glass and hazy airspace densities with peripheral and subpleural distribution most consistent with multifocal pneumonia likely viral or atypical in etiology and with pattern suggestive of COVID 19. Clinical correlation is recommended. There is no pleural effusion pneumothorax. The central airways are patent. Upper Abdomen: Indeterminate 13 mm hypodense lesion in the right lobe of  the liver similar to in the right the study of 2019, likely a benign or indolent process. Clinical correlation is recommended Musculoskeletal: Degenerative changes of the spine. No acute osseous pathology. Review of the MIP images confirms the above findings. IMPRESSION: 1. No CT evidence of pulmonary embolism. 2. Multifocal pneumonia likely viral or atypical in etiology and with pattern suggestive of COVID 19. Clinical correlation is recommended. 3. Aortic Atherosclerosis (ICD10-I70.0). Electronically Signed   By: Elgie Collard M.D.   On: 06/29/2020 21:20   DG Chest Port 1 View  Result Date: 06/29/2020 CLINICAL DATA:  Shortness of breath. EXAM: PORTABLE CHEST 1 VIEW COMPARISON:  October 13, 2015 FINDINGS: The heart,  hila, and mediastinum are normal. No pneumothorax. Mild opacity in the left base. Mild patchy opacity in the right base. No other acute abnormalities. IMPRESSION: Mild bibasilar opacities may represent atelectasis or developing infiltrates. Recommend clinical correlation and short-term follow-up imaging to ensure resolution. Electronically Signed   By: Gerome Sam III M.D   On: 06/29/2020 16:30    Assessment/Plan Present on Admission: . Acute respiratory failure due to COVID-19 [facility]) -Admit to med telemetry -IV Decadron and remdesivir -Oxygen nebulizers as needed -Inflammatory markers were ordered for the morning.  Marland Kitchen AKI (acute kidney injury) (HCC) -Mild, gentle IV fluid hydration, BMP in a.m.  . Atrial fibrillation (HCC) -Stable, home meds resumed  Diabetes mellitus -Home meds resumed except for Metformin.  Sliding scale insulin ordered  . Essential hypertension -Stable, home meds resumed  Ambriella Kitt 06/29/2020, 10:01 PM

## 2020-06-29 NOTE — ED Provider Notes (Signed)
[facility] EMERGENCY DEPARTMENT Provider Note   CSN: [id number] Arrival date & time: 06/29/20  1528     History Chief Complaint  Patient presents with  . Shortness of Breath    Andre Friedman is a 74 y.o. male with a past medical history of hypertension, atrial fibrillation on anticoagulation, lower back pain, verbal report of emphysema per patient presenting to the ED today for shortness of breath, decreased p.o. intake and "not looking well" according to family at home.  Patient notably tested positive for COVID-19 about 1.5 weeks ago.  He states that he has been very worried about his wife who has been admitted for the same.  States that symptoms have persisted and may be gradually worsened practically in terms of decreased p.o. intake throughout the week today.  He states that he has been able to ambulate reasonably well without becoming sniffily short of breath.  Endorses continued dry cough, no change in cough.  Endorses ongoing fevers.  Denies chest pain.  The history is provided by the patient.  Shortness of Breath Severity:  Moderate Onset quality:  Gradual Duration:  1 week Timing:  Constant Progression:  Worsening Chronicity:  New Context: activity   Relieved by:  Rest Worsened by:  Exertion Associated symptoms: cough, fever and headaches   Associated symptoms: no abdominal pain, no chest pain, no rash and no vomiting        Past Medical History:  Diagnosis Date  . Atrial fibrillation [facility])     Patient Active Problem List   Diagnosis Date Noted  . Acute respiratory failure due to COVID-19 (HCC) 06/29/2020  . AKI (acute kidney injury) (HCC) 06/29/2020  . Syncope and collapse 06/29/2020  . Essential hypertension 06/17/2007  . Atrial fibrillation (HCC) 06/17/2007  . LOW BACK PAIN 06/17/2007    History reviewed. No pertinent surgical history.     History reviewed. No pertinent family history.  Social History   Tobacco Use  . Smoking  status: Former Smoker    Packs/day: 0.50    Years: 13.00    Pack years: 6.50    Types: Cigarettes    Start date: 05/13/1987    Quit date: 05/18/2000    Years since quitting: 20.1  . Smokeless tobacco: Never Used  Substance Use Topics  . Alcohol use: No  . Drug use: No    Home Medications Prior to Admission medications   Medication Sig Start Date End Date Taking? Authorizing Provider  aspirin EC 81 MG tablet Take 162 mg by mouth at bedtime.   Yes [provider]  atorvastatin (LIPITOR) 40 MG tablet Take 40 mg by mouth at bedtime.  09/24/15  Yes [provider]  carvedilol (COREG) 6.25 MG tablet Take 6.25 mg by mouth 2 (two) times daily. 05/25/20  Yes [provider]  dorzolamide (TRUSOPT) 2 % ophthalmic solution Place 1 drop into both eyes 2 (two) times daily. 04/28/20  Yes [provider]  ezetimibe (ZETIA) 10 MG tablet Take 10 mg by mouth daily. 05/02/20  Yes [provider]  flecainide (TAMBOCOR) 50 MG tablet Take 50 mg by mouth 2 (two) times daily.  02/18/17  Yes [provider]  glipiZIDE (GLUCOTROL XL) 5 MG 24 hr tablet Take 5 mg by mouth daily. 05/02/20  Yes [provider]  latanoprost (XALATAN) 0.005 % ophthalmic solution Place 1 drop into both eyes at bedtime. 01/15/20  Yes [provider]  metFORMIN (GLUCOPHAGE) 500 MG tablet Take 250 mg by mouth  daily. 04/12/20  Yes [provider]  ramipril (ALTACE) 10 MG capsule Take 10 mg by mouth at bedtime.  09/24/15  Yes [provider]    Allergies    Penicillins  Review of Systems   Review of Systems  Constitutional: Positive for appetite change, chills, fever and unexpected weight change.  HENT: Negative for facial swelling and voice change.   Eyes: Negative for redness and visual disturbance.  Respiratory: Positive for cough and shortness of breath.   Cardiovascular: Negative for chest pain and palpitations.  Gastrointestinal: Positive for  nausea. Negative for abdominal pain and vomiting.  Genitourinary: Negative for difficulty urinating and dysuria.  Musculoskeletal: Positive for myalgias. Negative for gait problem and joint swelling.  Skin: Negative for rash and wound.  Neurological: Positive for headaches. Negative for dizziness.  Psychiatric/Behavioral: Negative for confusion and suicidal ideas.    Physical Exam Updated Vital Signs BP 117/74 (BP Location: Right Arm)   Pulse 96   Temp (!) 100.8 F (38.2 C) (Oral)   Resp (!) 23   Ht 5' 6.5" (1.689 m)   Wt 77.1 kg   SpO2 95%   BMI 27.03 kg/m   Physical Exam Constitutional:      General: He is not in acute distress.    Appearance: He is ill-appearing.  HENT:     Head: Normocephalic and atraumatic.     Mouth/Throat:     Mouth: Mucous membranes are moist.     Pharynx: Oropharynx is clear.  Eyes:     General: No scleral icterus.    Pupils: Pupils are equal, round, and reactive to light.  Cardiovascular:     Rate and Rhythm: Normal rate and regular rhythm.     Pulses: Normal pulses.  Pulmonary:     Effort: Tachypnea present.     Breath sounds: Normal breath sounds.  Abdominal:     General: There is no distension.     Tenderness: There is no abdominal tenderness.  Musculoskeletal:        General: No tenderness or deformity.     Cervical back: Normal range of motion and neck supple.     Right lower leg: No edema.     Left lower leg: No edema.  Neurological:     General: No focal deficit present.     Mental Status: He is alert and oriented to person, place, and time.  Psychiatric:        Mood and Affect: Mood normal.        Behavior: Behavior normal.     ED Results / Procedures / Treatments   Labs (all labs ordered are listed, but only abnormal results are displayed) Labs Reviewed  COMPREHENSIVE METABOLIC PANEL - Abnormal; Notable for the following components:      Result Value   Sodium 133 (*)    CO2 20 (*)    Glucose, Bld 190 (*)    BUN 28  (*)    Creatinine, Ser 1.41 (*)    Calcium 8.0 (*)    Total Protein 5.9 (*)    Albumin 2.5 (*)    AST 149 (*)    ALT 102 (*)    GFR calc non Af Amer 50 (*)    GFR calc Af Amer 58 (*)    All other components within normal limits  D-DIMER, QUANTITATIVE (NOT AT [facility]) - Abnormal; Notable for the following components:   D-Dimer, Quant 3.38 (*)    All other components within normal limits  LACTATE DEHYDROGENASE -  Abnormal; Notable for the following components:   LDH 472 (*)    All other components within normal limits  FERRITIN - Abnormal; Notable for the following components:   Ferritin 3,472 (*)    All other components within normal limits  FIBRINOGEN - Abnormal; Notable for the following components:   Fibrinogen 686 (*)    All other components within normal limits  C-REACTIVE PROTEIN - Abnormal; Notable for the following components:   CRP 12.4 (*)    All other components within normal limits  CBC WITH DIFFERENTIAL/PLATELET - Abnormal; Notable for the following components:   Platelets 117 (*)    Lymphs Abs 0.5 (*)    All other components within normal limits  CULTURE, BLOOD (ROUTINE X 2)  CULTURE, BLOOD (ROUTINE X 2)  LACTIC ACID, PLASMA  LACTIC ACID, PLASMA  PROCALCITONIN  TRIGLYCERIDES  CBC WITH DIFFERENTIAL/PLATELET    EKG EKG Interpretation  Date/Time:  Saturday June 29 2020 15:43:22 EDT Ventricular Rate:  94 PR Interval:  168 QRS Duration: 74 QT Interval:  348 QTC Calculation: 435 R Axis:   59 Text Interpretation: Normal sinus rhythm Cannot rule out Anterior infarct , age undetermined Abnormal ECG When compared with ECG of 10/13/2015, No significant change was found Confirmed by Dione Booze ([id number]) on 06/29/2020 11:09:09 PM   Radiology CT Angio Chest PE W and/or Wo Contrast  Result Date: 06/29/2020 CLINICAL DATA:  74 year old male with concern for pulmonary embolism. EXAM: CT ANGIOGRAPHY CHEST WITH CONTRAST TECHNIQUE: Multidetector CT imaging of the chest was  performed using the standard protocol during bolus administration of intravenous contrast. Multiplanar CT image reconstructions and MIPs were obtained to evaluate the vascular anatomy. CONTRAST:  OMNIPAQUE IOHEXOL 350 MG/ML SOLN COMPARISON:  Chest radiograph dated 06/29/2020 and CT dated 09/21/2018. FINDINGS: Cardiovascular: There is no cardiomegaly or pericardial effusion. Coronary vascular calcification of the LAD and RCA. Mild atherosclerotic calcification of the thoracic aorta. No aneurysmal dilatation or dissection. Evaluation of the pulmonary arteries is limited due to respiratory motion artifact. No pulmonary artery embolus identified. Mediastinum/Nodes: Mildly enlarged subcarinal lymph node measures 16 mm short axis. Top-normal bilateral hilar lymph nodes, likely reactive. The esophagus and the thyroid gland are grossly unremarkable. No mediastinal fluid collection. Lungs/Pleura: Bilateral patchy ground-glass and hazy airspace densities with peripheral and subpleural distribution most consistent with multifocal pneumonia likely viral or atypical in etiology and with pattern suggestive of COVID 19. Clinical correlation is recommended. There is no pleural effusion pneumothorax. The central airways are patent. Upper Abdomen: Indeterminate 13 mm hypodense lesion in the right lobe of the liver similar to in the right the study of 2019, likely a benign or indolent process. Clinical correlation is recommended Musculoskeletal: Degenerative changes of the spine. No acute osseous pathology. Review of the MIP images confirms the above findings. IMPRESSION: 1. No CT evidence of pulmonary embolism. 2. Multifocal pneumonia likely viral or atypical in etiology and with pattern suggestive of COVID 19. Clinical correlation is recommended. 3. Aortic Atherosclerosis (ICD10-I70.0). Electronically Signed   By: Elgie Collard M.D.   On: 06/29/2020 21:20   DG Chest Port 1 View  Result Date: 06/29/2020 CLINICAL DATA:   Shortness of breath. EXAM: PORTABLE CHEST 1 VIEW COMPARISON:  October 13, 2015 FINDINGS: The heart, hila, and mediastinum are normal. No pneumothorax. Mild opacity in the left base. Mild patchy opacity in the right base. No other acute abnormalities. IMPRESSION: Mild bibasilar opacities may represent atelectasis or developing infiltrates. Recommend clinical correlation and short-term follow-up imaging to ensure  resolution. Electronically Signed   By: Gerome Sam III M.D   On: 06/29/2020 16:30    Procedures Procedures (including critical care time)  Medications Ordered in ED Medications  remdesivir 200 mg in sodium chloride 0.9% 250 mL IVPB (200 mg Intravenous New Bag/Given 06/29/20 2338)    Followed by  remdesivir 100 mg in sodium chloride 0.9 % 100 mL IVPB (has no administration in time range)  lactated ringers bolus 500 mL (0 mLs Intravenous Stopped 06/29/20 2256)  iohexol (OMNIPAQUE) 350 MG/ML injection 100 mL (100 mLs Intravenous Contrast Given 06/29/20 2106)  dexamethasone (DECADRON) injection 6 mg (6 mg Intravenous Given 06/29/20 2252)  cefTRIAXone (ROCEPHIN) 2 g in sodium chloride 0.9 % 100 mL IVPB (0 g Intravenous Stopped 06/29/20 2337)  azithromycin (ZITHROMAX) 500 mg in sodium chloride 0.9 % 250 mL IVPB (500 mg Intravenous New Bag/Given 06/29/20 2251)    ED Course  I have reviewed the triage vital signs and the nursing notes.  Pertinent labs & imaging results that were available during my care of the patient were reviewed by me and considered in my medical decision making (see chart for details).    MDM Rules/Calculators/A&P                         EKG findings by my read: Compared to prior: 10/14/2015.  Rate: 94 rhythm: sinus Axis: appropriate  PR: 168 QRS: 74 QTc: 435.  No evidence of ischemia or arrhythmia, nor any other pathologic findings concerning considering patient presentation. Findings discussed with attending who agrees.  Differential diagnosis considered: COVID-19  pneumonia, acute proximal respiratory failure, PE, ACS, CHF, pleural effusion, pulmonary edema, malignancy, sepsis  Patient presenting mostly due to familial concern for increased shortness of breath, malaise, weight loss, decreased p.o. tolerance.  The patient himself endorses frustration with his overall condition and his family's concern however is expressing willingness to stay for treatment even if that means admission.  Reportedly on arrival saturations in the mid 80s on room air for which he was placed on oxygen, my initial evaluation, satting high 90s on 4 L of nasal cannula, taken off with sats 90-92, if patient truly does have emphysema, may tolerate sats as low as 88% though anticipate dropping below this with any attempted exertion.  COVID-19 panel obtained from triage, reviewed by myself, significant for elevated LDH of 472, elevated ferritin at 3000 likely is an acute phase reactant, elevated CRP at 12 and elevated D-dimer of 3.38, creatinine slightly elevated from baseline at 1.4 with a GFR of 50.  Feel that patient would benefit from a CTA PE study despite GFR 50, will prehydrate with 500 cc of fluid.  Will test O2 saturation with ambulation.  No signs or symptoms of ACS, CHF my examination.  CT PE study showed no evidence of PE although diffuse multifocal pneumonia suggestive of known COVID-19 infection appreciable.  Patient desaturating as low as 89 at rest, not ambulated further, placed on 2 L nasal cannula.  Hospital medicine consulted for admission given new oxygen requirement and dyspnea with exertion.  They agreed admit to their service.   Final Clinical Impression(s) / ED Diagnoses Final diagnoses:  Acute respiratory failure with hypoxia (HCC)  COVID-19    Rx / DC Orders ED Discharge Orders    None     Labs, studies and imaging reviewed by myself and considered in medical decision making if ordered. Imaging interpreted by radiology. Pt was discussed with my  attending,  Dr. Particia Nearing  Electronically signed by:  Christiane Ha Redding8/29/202112:34 AM       Loree Fee, MD 06/30/20 Mike Gip    Jacalyn Lefevre, MD 06/30/20 [phone]

## 2020-06-29 NOTE — ED Triage Notes (Signed)
Pt BIB [facility] for eval of Covid+. Pt satting 84-86% on RA. Placed on 4LPM by EMS w/ improvement to 97%. Febrile to 101 w/ EMS, given 1000mg  tylenol w/ EMS, no more admin here.

## 2020-06-30 DIAGNOSIS — Z7984 Long term (current) use of oral hypoglycemic drugs: Secondary | ICD-10-CM | POA: Diagnosis not present

## 2020-06-30 DIAGNOSIS — K5900 Constipation, unspecified: Secondary | ICD-10-CM | POA: Diagnosis present

## 2020-06-30 DIAGNOSIS — T380X5A Adverse effect of glucocorticoids and synthetic analogues, initial encounter: Secondary | ICD-10-CM | POA: Diagnosis not present

## 2020-06-30 DIAGNOSIS — J9600 Acute respiratory failure, unspecified whether with hypoxia or hypercapnia: Secondary | ICD-10-CM | POA: Diagnosis not present

## 2020-06-30 DIAGNOSIS — J9601 Acute respiratory failure with hypoxia: Secondary | ICD-10-CM | POA: Diagnosis present

## 2020-06-30 DIAGNOSIS — Z8546 Personal history of malignant neoplasm of prostate: Secondary | ICD-10-CM | POA: Diagnosis not present

## 2020-06-30 DIAGNOSIS — I4891 Unspecified atrial fibrillation: Secondary | ICD-10-CM

## 2020-06-30 DIAGNOSIS — N179 Acute kidney failure, unspecified: Secondary | ICD-10-CM | POA: Diagnosis present

## 2020-06-30 DIAGNOSIS — R55 Syncope and collapse: Secondary | ICD-10-CM | POA: Diagnosis present

## 2020-06-30 DIAGNOSIS — Z283 Underimmunization status: Secondary | ICD-10-CM | POA: Diagnosis not present

## 2020-06-30 DIAGNOSIS — I10 Essential (primary) hypertension: Secondary | ICD-10-CM | POA: Diagnosis present

## 2020-06-30 DIAGNOSIS — E1165 Type 2 diabetes mellitus with hyperglycemia: Secondary | ICD-10-CM | POA: Diagnosis not present

## 2020-06-30 DIAGNOSIS — J1282 Pneumonia due to coronavirus disease 2019: Secondary | ICD-10-CM | POA: Diagnosis present

## 2020-06-30 DIAGNOSIS — Z7901 Long term (current) use of anticoagulants: Secondary | ICD-10-CM | POA: Diagnosis not present

## 2020-06-30 DIAGNOSIS — U071 COVID-19: Secondary | ICD-10-CM | POA: Diagnosis present

## 2020-06-30 DIAGNOSIS — I480 Paroxysmal atrial fibrillation: Secondary | ICD-10-CM | POA: Diagnosis present

## 2020-06-30 DIAGNOSIS — E871 Hypo-osmolality and hyponatremia: Secondary | ICD-10-CM | POA: Diagnosis present

## 2020-06-30 DIAGNOSIS — Z7982 Long term (current) use of aspirin: Secondary | ICD-10-CM | POA: Diagnosis not present

## 2020-06-30 DIAGNOSIS — H409 Unspecified glaucoma: Secondary | ICD-10-CM | POA: Diagnosis present

## 2020-06-30 DIAGNOSIS — R7401 Elevation of levels of liver transaminase levels: Secondary | ICD-10-CM | POA: Diagnosis present

## 2020-06-30 DIAGNOSIS — Z880 Allergy status to penicillin: Secondary | ICD-10-CM | POA: Diagnosis not present

## 2020-06-30 DIAGNOSIS — Z87891 Personal history of nicotine dependence: Secondary | ICD-10-CM | POA: Diagnosis not present

## 2020-06-30 DIAGNOSIS — Z79899 Other long term (current) drug therapy: Secondary | ICD-10-CM | POA: Diagnosis not present

## 2020-06-30 LAB — FERRITIN: Ferritin: 2659 ng/mL — ABNORMAL HIGH (ref 24–336)

## 2020-06-30 LAB — CBC
HCT: 36.7 % — ABNORMAL LOW (ref 39.0–52.0)
Hemoglobin: 12.1 g/dL — ABNORMAL LOW (ref 13.0–17.0)
MCH: 29.5 pg (ref 26.0–34.0)
MCHC: 33 g/dL (ref 30.0–36.0)
MCV: 89.5 fL (ref 80.0–100.0)
Platelets: 115 10*3/uL — ABNORMAL LOW (ref 150–400)
RBC: 4.1 MIL/uL — ABNORMAL LOW (ref 4.22–5.81)
RDW: 13.7 % (ref 11.5–15.5)
WBC: 5.3 10*3/uL (ref 4.0–10.5)
nRBC: 0 % (ref 0.0–0.2)

## 2020-06-30 LAB — COMPREHENSIVE METABOLIC PANEL
ALT: 81 U/L — ABNORMAL HIGH (ref 0–44)
AST: 104 U/L — ABNORMAL HIGH (ref 15–41)
Albumin: 2.1 g/dL — ABNORMAL LOW (ref 3.5–5.0)
Alkaline Phosphatase: 49 U/L (ref 38–126)
Anion gap: 10 (ref 5–15)
BUN: 23 mg/dL (ref 8–23)
CO2: 22 mmol/L (ref 22–32)
Calcium: 7.9 mg/dL — ABNORMAL LOW (ref 8.9–10.3)
Chloride: 102 mmol/L (ref 98–111)
Creatinine, Ser: 1.04 mg/dL (ref 0.61–1.24)
GFR calc Af Amer: >60 mL/min (ref >60)
GFR calc non Af Amer: >60 mL/min (ref >60)
Glucose, Bld: 160 mg/dL — ABNORMAL HIGH (ref 70–99)
Potassium: 4.5 mmol/L (ref 3.5–5.1)
Sodium: 134 mmol/L — ABNORMAL LOW (ref 135–145)
Total Bilirubin: 0.9 mg/dL (ref 0.3–1.2)
Total Protein: 5.4 g/dL — ABNORMAL LOW (ref 6.5–8.1)

## 2020-06-30 LAB — MAGNESIUM: Magnesium: 1.9 mg/dL (ref 1.7–2.4)

## 2020-06-30 LAB — CREATININE, SERUM
Creatinine, Ser: 1.04 mg/dL (ref 0.61–1.24)
GFR calc Af Amer: >60 mL/min (ref >60)
GFR calc non Af Amer: >60 mL/min (ref >60)

## 2020-06-30 LAB — HEMOGLOBIN A1C
Hgb A1c MFr Bld: 6.7 % — ABNORMAL HIGH (ref 4.8–5.6)
Mean Plasma Glucose: 145.59 mg/dL

## 2020-06-30 LAB — PROTIME-INR
INR: 1.3 — ABNORMAL HIGH (ref 0.8–1.2)
Prothrombin Time: 15.2 seconds (ref 11.4–15.2)

## 2020-06-30 LAB — CBG MONITORING, ED
Glucose-Capillary: 140 mg/dL — ABNORMAL HIGH (ref 70–99)
Glucose-Capillary: 193 mg/dL — ABNORMAL HIGH (ref 70–99)
Glucose-Capillary: 169 mg/dL — ABNORMAL HIGH (ref 70–99)

## 2020-06-30 LAB — FIBRINOGEN: Fibrinogen: 712 mg/dL — ABNORMAL HIGH (ref 210–475)

## 2020-06-30 LAB — D-DIMER, QUANTITATIVE: D-Dimer, Quant: 2.62 ug/mL-FEU — ABNORMAL HIGH (ref 0.00–0.50)

## 2020-06-30 LAB — TRIGLYCERIDES: Triglycerides: 84 mg/dL (ref <150)

## 2020-06-30 LAB — GLUCOSE, CAPILLARY: Glucose-Capillary: 201 mg/dL — ABNORMAL HIGH (ref 70–99)

## 2020-06-30 MED ORDER — ASPIRIN EC 81 MG PO TBEC
162.0000 mg | DELAYED_RELEASE_TABLET | Freq: Every day | ORAL | Status: DC
  Administered 2020-06-30 – 2020-07-04 (×6): 162 mg via ORAL
  Filled 2020-06-30 (×6): qty 2

## 2020-06-30 MED ORDER — EZETIMIBE 10 MG PO TABS
10.0000 mg | ORAL_TABLET | Freq: Every day | ORAL | Status: DC
  Administered 2020-06-30 – 2020-07-05 (×6): 10 mg via ORAL
  Filled 2020-06-30 (×6): qty 1

## 2020-06-30 MED ORDER — ONDANSETRON HCL 4 MG PO TABS: 4.0000 mg | ORAL_TABLET | Freq: Four times a day (QID) | ORAL | Status: DC | PRN

## 2020-06-30 MED ORDER — ENOXAPARIN SODIUM 40 MG/0.4ML ~~LOC~~ SOLN
40.0000 mg | Freq: Every day | SUBCUTANEOUS | Status: DC
  Administered 2020-06-30 – 2020-07-05 (×6): 40 mg via SUBCUTANEOUS
  Filled 2020-06-30 (×7): qty 0.4

## 2020-06-30 MED ORDER — HYDROCOD POLST-CPM POLST ER 10-8 MG/5ML PO SUER
5.0000 mL | Freq: Two times a day (BID) | ORAL | Status: DC | PRN
  Administered 2020-06-30 – 2020-07-02 (×3): 5 mL via ORAL
  Filled 2020-06-30 (×3): qty 5

## 2020-06-30 MED ORDER — DEXAMETHASONE 4 MG PO TABS
6.0000 mg | ORAL_TABLET | Freq: Every day | ORAL | Status: DC
  Administered 2020-06-30 – 2020-07-02 (×3): 6 mg via ORAL
  Filled 2020-06-30 (×3): qty 2

## 2020-06-30 MED ORDER — FLECAINIDE ACETATE 50 MG PO TABS
50.0000 mg | ORAL_TABLET | Freq: Two times a day (BID) | ORAL | Status: DC
  Administered 2020-06-30 – 2020-07-05 (×12): 50 mg via ORAL
  Filled 2020-06-30 (×13): qty 1

## 2020-06-30 MED ORDER — RAMIPRIL 10 MG PO CAPS
10.0000 mg | ORAL_CAPSULE | Freq: Every day | ORAL | Status: DC
  Administered 2020-06-30 (×2): 10 mg via ORAL
  Filled 2020-06-30 (×3): qty 1

## 2020-06-30 MED ORDER — GUAIFENESIN 100 MG/5ML PO SOLN
10.0000 mL | Freq: Four times a day (QID) | ORAL | Status: DC | PRN
  Administered 2020-06-30 – 2020-07-04 (×6): 200 mg via ORAL
  Filled 2020-06-30 (×6): qty 10

## 2020-06-30 MED ORDER — GLIPIZIDE ER 5 MG PO TB24
5.0000 mg | ORAL_TABLET | Freq: Every day | ORAL | Status: DC
  Administered 2020-06-30 – 2020-07-05 (×6): 5 mg via ORAL
  Filled 2020-06-30 (×8): qty 1

## 2020-06-30 MED ORDER — INSULIN ASPART 100 UNIT/ML ~~LOC~~ SOLN
0.0000 [IU] | Freq: Every day | SUBCUTANEOUS | Status: DC
  Administered 2020-06-30 – 2020-07-02 (×2): 2 [IU] via SUBCUTANEOUS
  Administered 2020-07-03: 3 [IU] via SUBCUTANEOUS
  Administered 2020-07-04: 2 [IU] via SUBCUTANEOUS

## 2020-06-30 MED ORDER — ACETAMINOPHEN 650 MG RE SUPP: 650.0000 mg | Freq: Four times a day (QID) | RECTAL | Status: DC | PRN

## 2020-06-30 MED ORDER — POLYETHYLENE GLYCOL 3350 17 G PO PACK: 17.0000 g | PACK | Freq: Every day | ORAL | Status: DC | PRN

## 2020-06-30 MED ORDER — ACETAMINOPHEN 325 MG PO TABS: 650.0000 mg | ORAL_TABLET | Freq: Four times a day (QID) | ORAL | Status: DC | PRN

## 2020-06-30 MED ORDER — HYDRALAZINE HCL 20 MG/ML IJ SOLN: 5.0000 mg | INTRAMUSCULAR | Status: DC | PRN

## 2020-06-30 MED ORDER — LORAZEPAM 1 MG PO TABS
1.0000 mg | ORAL_TABLET | Freq: Four times a day (QID) | ORAL | Status: DC | PRN
  Administered 2020-06-30: 1 mg via ORAL
  Filled 2020-06-30: qty 1

## 2020-06-30 MED ORDER — ATORVASTATIN CALCIUM 40 MG PO TABS
40.0000 mg | ORAL_TABLET | Freq: Every day | ORAL | Status: DC
  Administered 2020-06-30 – 2020-07-04 (×6): 40 mg via ORAL
  Filled 2020-06-30 (×6): qty 1

## 2020-06-30 MED ORDER — INSULIN ASPART 100 UNIT/ML ~~LOC~~ SOLN
0.0000 [IU] | Freq: Three times a day (TID) | SUBCUTANEOUS | Status: DC
  Administered 2020-06-30 (×2): 2 [IU] via SUBCUTANEOUS
  Administered 2020-07-01: 3 [IU] via SUBCUTANEOUS
  Administered 2020-07-01: 2 [IU] via SUBCUTANEOUS
  Administered 2020-07-01 – 2020-07-02 (×4): 3 [IU] via SUBCUTANEOUS
  Administered 2020-07-03: 5 [IU] via SUBCUTANEOUS
  Administered 2020-07-03 – 2020-07-04 (×3): 3 [IU] via SUBCUTANEOUS
  Administered 2020-07-04 (×2): 5 [IU] via SUBCUTANEOUS

## 2020-06-30 MED ORDER — CARVEDILOL 6.25 MG PO TABS
6.2500 mg | ORAL_TABLET | Freq: Two times a day (BID) | ORAL | Status: DC
  Administered 2020-06-30 – 2020-07-01 (×4): 6.25 mg via ORAL
  Filled 2020-06-30 (×2): qty 2
  Filled 2020-06-30 (×2): qty 1

## 2020-06-30 MED ORDER — ONDANSETRON HCL 4 MG/2ML IJ SOLN: 4.0000 mg | Freq: Four times a day (QID) | INTRAMUSCULAR | Status: DC | PRN

## 2020-06-30 NOTE — ED Notes (Signed)
Breakfast tray ordered 

## 2020-06-30 NOTE — Progress Notes (Signed)
PROGRESS NOTE    Andre Friedman  VQQ:[id number] DOB: 1946/04/03 DOA: 06/29/2020 PCP: Drosinis, Leonia Reader, PA-C    Chief Complaint  Patient presents with  . Shortness of Breath    Brief Narrative:  74 year old male with prior h/o paroxysmal atrial fibrillation, hypertension. Recently diagnosed with COVID 19 in [place] about 1.5 weeks, presents to ED for persistent cough, decreased appetite, generalized weakness, and sob. Unfortunately he is not vaccinated.   Assessment & Plan:   Principal Problem:   Acute respiratory failure due to COVID-19 [facility]) Active Problems:   Essential hypertension   Atrial fibrillation (HCC)   AKI (acute kidney injury) (HCC)   Syncope and collapse   Acute respiratory failure with hypoxia (HCC)   COVID 19 illness: Admit for for evaluation and management.  Cough medication, IV remdesivir and IV decadron.  [place] oxygen as needed, .  Follow inflammatory markers.     Paroxysmal atrial fibrillation:  Rate controlled.     New onset diabetes Mellitus:  Hemoglobin A1c is 6.7 . Start the patient on SSI.  Will get diabetes co ordinater consult.    Essential hypertension:  Well controlled.  Resume home meds.     AKI:  Resolved with IV fluids.    Elevated liver enzymes :  Improving.    Mild hyponatremia:  Improving.    DVT prophylaxis: lovenox.  Code Status: full code.  Family Communication: none at bedside.  Disposition:   Status is: Inpatient  Remains inpatient appropriate because:IV treatments appropriate due to intensity of illness or inability to take PO   Dispo: The patient is from: Home              Anticipated d/c is to: pending.               Anticipated d/c date is: 2 days              Patient currently is not medically stable to d/c.       Consultants:   None.    Procedures: none.    Antimicrobials:  Anti-infectives (From admission, onward)   Start     Dose/Rate Route Frequency Ordered Stop   06/30/20 1000   remdesivir 100 mg in sodium chloride 0.9 % 100 mL IVPB       "Followed by" Linked Group Details   100 mg 200 mL/hr over 30 Minutes Intravenous Daily 06/29/20 2133 07/04/20 0959   06/29/20 2145  cefTRIAXone (ROCEPHIN) 2 g in sodium chloride 0.9 % 100 mL IVPB        2 g 200 mL/hr over 30 Minutes Intravenous  Once 06/29/20 2130 06/29/20 2337   06/29/20 2145  azithromycin (ZITHROMAX) 500 mg in sodium chloride 0.9 % 250 mL IVPB        500 mg 250 mL/hr over 60 Minutes Intravenous  Once 06/29/20 2130 06/30/20 0012   06/29/20 2145  remdesivir 200 mg in sodium chloride 0.9% 250 mL IVPB       "Followed by" Linked Group Details   200 mg 580 mL/hr over 30 Minutes Intravenous Once 06/29/20 2133 06/30/20 0012          Subjective: Anxious and coughing.   Objective: Vitals:   06/30/20 0315 06/30/20 0525 06/30/20 0532 06/30/20 0753  BP: 100/63 102/70  (!) 112/94  Pulse: 75 75  78  Resp: (!) 34 (!) 23    Temp:   98.2 F (36.8 C) 98 F (36.7 C)  TempSrc:   Oral Oral  SpO2: 94% 94%  91%  Weight:      Height:        Intake/Output Summary (Last 24 hours) at 06/30/2020 1353 Last data filed at 06/29/2020 2337 Gross per 24 hour  Intake 600 ml  Output --  Net 600 ml   Filed Weights   06/29/20 1540 06/29/20 1541  Weight: 72 kg 77.1 kg    Examination:  General exam: Appears calm and comfortable  Respiratory system: Clear to auscultation. Respiratory effort normal. Cardiovascular system: S1 & S2 heard, RRR. No JVD, No pedal edema. Gastrointestinal system: Abdomen is nondistended, soft and non tender.  Normal bowel sounds heard. Central nervous system: Alert and oriented. No focal neurological deficits. Extremities: Symmetric 5 x 5 power. Skin: No rashes, lesions or ulcers Psychiatry:  Mood & affect appropriate.     Data Reviewed: I have personally reviewed following labs and imaging studies  CBC: Recent Labs  Lab 06/29/20 1835 06/30/20 0526  WBC 7.5 5.3  NEUTROABS 6.5  --    HGB 13.1 12.1*  HCT 39.9 36.7*  MCV 89.5 89.5  PLT 117* 115*    Basic Metabolic Panel: Recent Labs  Lab 06/29/20 1617 06/30/20 0526  NA 133* 134*  K 4.0 4.5  CL 102 102  CO2 20* 22  GLUCOSE 190* 160*  BUN 28* 23  CREATININE 1.41* 1.04  1.04  CALCIUM 8.0* 7.9*  MG  --  1.9    GFR: Estimated Creatinine Clearance: 59.9 mL/min (by C-G formula based on SCr of 1.04 mg/dL).  Liver Function Tests: Recent Labs  Lab 06/29/20 1617 06/30/20 0526  AST 149* 104*  ALT 102* 81*  ALKPHOS 53 49  BILITOT 1.1 0.9  PROT 5.9* 5.4*  ALBUMIN 2.5* 2.1*    CBG: Recent Labs  Lab 06/30/20 0136 06/30/20 0849  GLUCAP 140* 193*     No results found for this or any previous visit (from the past 240 hour(s)).       Radiology Studies: CT Angio Chest PE W and/or Wo Contrast  Result Date: 06/29/2020 CLINICAL DATA:  74 year old male with concern for pulmonary embolism. EXAM: CT ANGIOGRAPHY CHEST WITH CONTRAST TECHNIQUE: Multidetector CT imaging of the chest was performed using the standard protocol during bolus administration of intravenous contrast. Multiplanar CT image reconstructions and MIPs were obtained to evaluate the vascular anatomy. CONTRAST:  OMNIPAQUE IOHEXOL 350 MG/ML SOLN COMPARISON:  Chest radiograph dated 06/29/2020 and CT dated 09/21/2018. FINDINGS: Cardiovascular: There is no cardiomegaly or pericardial effusion. Coronary vascular calcification of the LAD and RCA. Mild atherosclerotic calcification of the thoracic aorta. No aneurysmal dilatation or dissection. Evaluation of the pulmonary arteries is limited due to respiratory motion artifact. No pulmonary artery embolus identified. Mediastinum/Nodes: Mildly enlarged subcarinal lymph node measures 16 mm short axis. Top-normal bilateral hilar lymph nodes, likely reactive. The esophagus and the thyroid gland are grossly unremarkable. No mediastinal fluid collection. Lungs/Pleura: Bilateral patchy ground-glass and hazy  airspace densities with peripheral and subpleural distribution most consistent with multifocal pneumonia likely viral or atypical in etiology and with pattern suggestive of COVID 19. Clinical correlation is recommended. There is no pleural effusion pneumothorax. The central airways are patent. Upper Abdomen: Indeterminate 13 mm hypodense lesion in the right lobe of the liver similar to in the right the study of 2019, likely a benign or indolent process. Clinical correlation is recommended Musculoskeletal: Degenerative changes of the spine. No acute osseous pathology. Review of the MIP images confirms the above findings. IMPRESSION: 1. No CT evidence  of pulmonary embolism. 2. Multifocal pneumonia likely viral or atypical in etiology and with pattern suggestive of COVID 19. Clinical correlation is recommended. 3. Aortic Atherosclerosis (ICD10-I70.0). Electronically Signed   By: Elgie Collard M.D.   On: 06/29/2020 21:20   DG Chest Port 1 View  Result Date: 06/29/2020 CLINICAL DATA:  Shortness of breath. EXAM: PORTABLE CHEST 1 VIEW COMPARISON:  October 13, 2015 FINDINGS: The heart, hila, and mediastinum are normal. No pneumothorax. Mild opacity in the left base. Mild patchy opacity in the right base. No other acute abnormalities. IMPRESSION: Mild bibasilar opacities may represent atelectasis or developing infiltrates. Recommend clinical correlation and short-term follow-up imaging to ensure resolution. Electronically Signed   By: Gerome Sam III M.D   On: 06/29/2020 16:30        Scheduled Meds: . aspirin EC  162 mg Oral QHS  . atorvastatin  40 mg Oral QHS  . carvedilol  6.25 mg Oral BID  . dexamethasone  6 mg Oral Daily  . enoxaparin (LOVENOX) injection  40 mg Subcutaneous Daily  . ezetimibe  10 mg Oral Daily  . flecainide  50 mg Oral BID  . glipiZIDE  5 mg Oral Q breakfast  . insulin aspart  0-5 Units Subcutaneous QHS  . insulin aspart  0-9 Units Subcutaneous TID WC  . ramipril  10 mg Oral  QHS   Continuous Infusions: . remdesivir 100 mg in NS 100 mL 100 mg (06/30/20 1344)     LOS: 0 days        Kathlen Mody, MD [facility]   To contact the attending provider between 7A-7P or the covering provider during after hours 7P-7A, please log into the web site [email or web address] and access using universal [place] password for that web site. If you do not have the password, please call the hospital operator.  06/30/2020, 1:53 PM

## 2020-07-01 LAB — COMPREHENSIVE METABOLIC PANEL
ALT: 70 U/L — ABNORMAL HIGH (ref 0–44)
AST: 64 U/L — ABNORMAL HIGH (ref 15–41)
Albumin: 1.9 g/dL — ABNORMAL LOW (ref 3.5–5.0)
Alkaline Phosphatase: 46 U/L (ref 38–126)
Anion gap: 11 (ref 5–15)
BUN: 32 mg/dL — ABNORMAL HIGH (ref 8–23)
CO2: 21 mmol/L — ABNORMAL LOW (ref 22–32)
Calcium: 8.1 mg/dL — ABNORMAL LOW (ref 8.9–10.3)
Chloride: 104 mmol/L (ref 98–111)
Creatinine, Ser: 1.06 mg/dL (ref 0.61–1.24)
GFR calc Af Amer: >60 mL/min (ref >60)
GFR calc non Af Amer: >60 mL/min (ref >60)
Glucose, Bld: 232 mg/dL — ABNORMAL HIGH (ref 70–99)
Potassium: 4.3 mmol/L (ref 3.5–5.1)
Sodium: 136 mmol/L (ref 135–145)
Total Bilirubin: 0.6 mg/dL (ref 0.3–1.2)
Total Protein: 5.2 g/dL — ABNORMAL LOW (ref 6.5–8.1)

## 2020-07-01 LAB — GLUCOSE, CAPILLARY
Glucose-Capillary: 228 mg/dL — ABNORMAL HIGH (ref 70–99)
Glucose-Capillary: 173 mg/dL — ABNORMAL HIGH (ref 70–99)

## 2020-07-01 LAB — CBC
HCT: 35.6 % — ABNORMAL LOW (ref 39.0–52.0)
Hemoglobin: 12 g/dL — ABNORMAL LOW (ref 13.0–17.0)
MCH: 29.7 pg (ref 26.0–34.0)
MCHC: 33.7 g/dL (ref 30.0–36.0)
MCV: 88.1 fL (ref 80.0–100.0)
Platelets: 159 10*3/uL (ref 150–400)
RBC: 4.04 MIL/uL — ABNORMAL LOW (ref 4.22–5.81)
RDW: 13.5 % (ref 11.5–15.5)
WBC: 9.5 10*3/uL (ref 4.0–10.5)
nRBC: 0 % (ref 0.0–0.2)

## 2020-07-01 LAB — C-REACTIVE PROTEIN: CRP: 10.5 mg/dL — ABNORMAL HIGH (ref <1.0)

## 2020-07-01 LAB — D-DIMER, QUANTITATIVE: D-Dimer, Quant: 1.58 ug/mL-FEU — ABNORMAL HIGH (ref 0.00–0.50)

## 2020-07-01 LAB — BRAIN NATRIURETIC PEPTIDE: B Natriuretic Peptide: 106.8 pg/mL — ABNORMAL HIGH (ref 0.0–100.0)

## 2020-07-01 LAB — PROCALCITONIN: Procalcitonin: 0.18 ng/mL

## 2020-07-01 MED ORDER — LINAGLIPTIN 5 MG PO TABS
5.0000 mg | ORAL_TABLET | Freq: Every day | ORAL | Status: DC
  Administered 2020-07-01 – 2020-07-05 (×5): 5 mg via ORAL
  Filled 2020-07-01 (×5): qty 1

## 2020-07-01 MED ORDER — PANTOPRAZOLE SODIUM 40 MG PO TBEC
40.0000 mg | DELAYED_RELEASE_TABLET | Freq: Every day | ORAL | Status: DC
  Administered 2020-07-01 – 2020-07-05 (×5): 40 mg via ORAL
  Filled 2020-07-01 (×4): qty 1

## 2020-07-01 NOTE — Progress Notes (Addendum)
PROGRESS NOTE                                                                                                                                                                                                             Patient Demographics:    Andre Friedman, is a 74 y.o. male, DOB - Mar 16, 1946, FGH:[id number]  Admit date - 06/29/2020   Admitting Physician Gery Pray, MD  Outpatient Primary MD for the patient is Drosinis, Leonia Reader, PA-C  LOS - 1   Chief Complaint  Patient presents with   Shortness of Breath       Brief Narrative    74 year old male with past medical history of atrial fibrillation, diabetes mellitus, hypertension, prostate cancer, patient with recent trip to [place], where he was diagnosed with COVID-19 two weeks ago, patient presents to ED secondary to significant weakness, shortness of breath and cough, noted to be hypoxic, he was admitted for further work-up. -It is unvaccinated.   Subjective:    Griffin Dakin today he does report dyspnea, cough, generalized weakness. .   Assessment  & Plan :    Principal Problem:   Acute respiratory failure due to COVID-19 [facility]) Active Problems:   Essential hypertension   Atrial fibrillation (HCC)   AKI (acute kidney injury) (HCC)   Syncope and collapse   Acute respiratory failure with hypoxia (HCC)  Acute hypoxic respiratory failure due to COVID-19 pneumonia -Patient is unvaccinated -Patient on 2 L nasal cannula, CT chest on admission significant for multifocal bilateral opacity. -Continue with IV steroids. -Continue with IV remdesivir. -He is on 2 L, no indication for baricitinib. -Continue to trend inflammatory markers,they are significantly elevated, but trending down. -Discussed with patient, he was encouraged with incentive spirometry, flutter valve, out of bed to chair. -Elevated D-dimers, trending down, CTA chest negative for PE    COVID-19 Labs  Recent Labs     06/29/20 1617 06/30/20 0526 07/01/20 0734  DDIMER 3.38* 2.62* 1.58*  FERRITIN 3,472* 2,659*  --   LDH 472*  --   --   CRP 12.4*  --  10.5*    No results found for: SARSCOV2NAA  Paroxysmal A. Fib -He is on flecainide for heart rate control, I will continue, blood pressure is soft I will DC Coreg, continue with aspirin -Patient not on anticoagulation, by reviewing his  primary cardiologist note he is not on anticoagulation given last A. fib episode was many years ago, will do any changes currently to his primary cardiologist plan.  diabetes mellitus -With A1c of 6.7, this is likely worsened due to steroids as well, continue with sliding scale, will add Tradjenta.  Transaminitis -The setting of Covid infection, trending down.  Hypertension -Blood pressure is low, will discontinue Coreg and lisinopril.  AKI -Resolved with IV fluids  Code Status : Full  Family Communication  : Updated wife and daughter by phone  Disposition Plan  :  Status is: Inpatient  Remains inpatient appropriate because:Hemodynamically unstable   Dispo: The patient is from: Home              Anticipated d/c is to: Home              Anticipated d/c date is: 3 days              Patient currently is not medically stable to d/c.      Consults  :  None  Procedures  : none  DVT Prophylaxis  :  [place] lovenox  Lab Results  Component Value Date   PLT 159 07/01/2020    Antibiotics  :    Anti-infectives (From admission, onward)   Start     Dose/Rate Route Frequency Ordered Stop   06/30/20 1000  remdesivir 100 mg in sodium chloride 0.9 % 100 mL IVPB       "Followed by" Linked Group Details   100 mg 200 mL/hr over 30 Minutes Intravenous Daily 06/29/20 2133 07/04/20 0959   06/29/20 2145  cefTRIAXone (ROCEPHIN) 2 g in sodium chloride 0.9 % 100 mL IVPB        2 g 200 mL/hr over 30 Minutes Intravenous  Once 06/29/20 2130 06/29/20 2337   06/29/20 2145  azithromycin (ZITHROMAX) 500 mg in sodium chloride  0.9 % 250 mL IVPB        500 mg 250 mL/hr over 60 Minutes Intravenous  Once 06/29/20 2130 06/30/20 0012   06/29/20 2145  remdesivir 200 mg in sodium chloride 0.9% 250 mL IVPB       "Followed by" Linked Group Details   200 mg 580 mL/hr over 30 Minutes Intravenous Once 06/29/20 2133 06/30/20 0012        Objective:   Vitals:   06/30/20 1730 06/30/20 1747 06/30/20 2130 07/01/20 0834  BP: 105/74 110/72 107/75 (!) 90/59  Pulse: 74 77 75 67  Resp: (!) 24 20 20 16   Temp:  98 F (36.7 C) 98 F (36.7 C) (!) 97.5 F (36.4 C)  TempSrc:  Oral Oral   SpO2: 95% 93% 92% 90%  Weight:      Height:        Wt Readings from Last 3 Encounters:  06/29/20 77.1 kg  10/06/18 71.7 kg  09/15/18 72.6 kg     Intake/Output Summary (Last 24 hours) at 07/01/2020 1204 Last data filed at 06/30/2020 1415 Gross per 24 hour  Intake 100 ml  Output --  Net 100 ml     Physical Exam  Awake Alert, Oriented X 3, No new F.N deficits, Normal affect Symmetrical Chest wall movement, Good air movement bilaterally, CTAB RRR,No Gallops,Rubs or new Murmurs, No Parasternal Heave +ve B.Sounds, Abd Soft, No tenderness, No rebound - guarding or rigidity. No Cyanosis, Clubbing or edema, No new Rash or bruise      Data Review:    CBC Recent Labs  Lab  06/29/20 1835 06/30/20 0526 07/01/20 0734  WBC 7.5 5.3 9.5  HGB 13.1 12.1* 12.0*  HCT 39.9 36.7* 35.6*  PLT 117* 115* 159  MCV 89.5 89.5 88.1  MCH 29.4 29.5 29.7  MCHC 32.8 33.0 33.7  RDW 13.8 13.7 13.5  LYMPHSABS 0.5*  --   --   MONOABS 0.4  --   --   EOSABS 0.0  --   --   BASOSABS 0.0  --   --     Chemistries  Recent Labs  Lab 06/29/20 1617 06/30/20 0526 07/01/20 0734  NA 133* 134* 136  K 4.0 4.5 4.3  CL 102 102 104  CO2 20* 22 21*  GLUCOSE 190* 160* 232*  BUN 28* 23 32*  CREATININE 1.41* 1.04   1.04 1.06  CALCIUM 8.0* 7.9* 8.1*  MG  --  1.9  --   AST 149* 104* 64*  ALT 102* 81* 70*  ALKPHOS 53 49 46  BILITOT 1.1 0.9 0.6    ------------------------------------------------------------------------------------------------------------------ Recent Labs    06/29/20 1617 06/30/20 0526  TRIG 133 84    Lab Results  Component Value Date   HGBA1C 6.7 (H) 06/30/2020   ------------------------------------------------------------------------------------------------------------------ No results for input(s): TSH, T4TOTAL, T3FREE, THYROIDAB in the last 72 hours.  Invalid input(s): FREET3 ------------------------------------------------------------------------------------------------------------------ Recent Labs    06/29/20 1617 06/30/20 0526  FERRITIN 3,472* 2,659*    Coagulation profile Recent Labs  Lab 06/30/20 0526  INR 1.3*    Recent Labs    06/30/20 0526 07/01/20 0734  DDIMER 2.62* 1.58*    Cardiac Enzymes No results for input(s): CKMB, TROPONINI, MYOGLOBIN in the last 168 hours.  Invalid input(s): CK ------------------------------------------------------------------------------------------------------------------    Component Value Date/Time   BNP 106.8 (H) 07/01/2020 0734    Inpatient Medications  Scheduled Meds:  aspirin EC  162 mg Oral QHS   atorvastatin  40 mg Oral QHS   carvedilol  6.25 mg Oral BID   dexamethasone  6 mg Oral Daily   enoxaparin (LOVENOX) injection  40 mg Subcutaneous Daily   ezetimibe  10 mg Oral Daily   flecainide  50 mg Oral BID   glipiZIDE  5 mg Oral Q breakfast   insulin aspart  0-5 Units Subcutaneous QHS   insulin aspart  0-9 Units Subcutaneous TID WC   ramipril  10 mg Oral QHS   Continuous Infusions:  remdesivir 100 mg in NS 100 mL 100 mg (07/01/20 1006)   PRN Meds:.acetaminophen **OR** acetaminophen, chlorpheniramine-HYDROcodone, guaiFENesin, hydrALAZINE, LORazepam, ondansetron **OR** ondansetron (ZOFRAN) IV, polyethylene glycol  Micro Results Recent Results (from the past 240 hour(s))  Blood Culture (routine x 2)     Status: None  (Preliminary result)   Collection Time: 06/29/20  3:56 PM   Specimen: BLOOD  Result Value Ref Range Status   Specimen Description BLOOD LEFT ANTECUBITAL  Final   Special Requests   Final    BOTTLES DRAWN AEROBIC AND ANAEROBIC Blood Culture results may not be optimal due to an excessive volume of blood received in culture bottles   Culture   Final    NO GROWTH 2 DAYS Performed at [facility], [address] [place], [place] [address]    Report Status PENDING  Incomplete  Blood Culture (routine x 2)     Status: None (Preliminary result)   Collection Time: 06/29/20  6:40 PM   Specimen: BLOOD RIGHT ARM  Result Value Ref Range Status   Specimen Description BLOOD RIGHT ARM  Final   Special  Requests   Final    BOTTLES DRAWN AEROBIC AND ANAEROBIC Blood Culture results may not be optimal due to an excessive volume of blood received in culture bottles   Culture   Final    NO GROWTH 2 DAYS Performed at [facility], [address] [place], [place] [address]    Report Status PENDING  Incomplete    Radiology Reports CT Angio Chest PE W and/or Wo Contrast  Result Date: 06/29/2020 CLINICAL DATA:  74 year old male with concern for pulmonary embolism. EXAM: CT ANGIOGRAPHY CHEST WITH CONTRAST TECHNIQUE: Multidetector CT imaging of the chest was performed using the standard protocol during bolus administration of intravenous contrast. Multiplanar CT image reconstructions and MIPs were obtained to evaluate the vascular anatomy. CONTRAST:  OMNIPAQUE IOHEXOL 350 MG/ML SOLN COMPARISON:  Chest radiograph dated 06/29/2020 and CT dated 09/21/2018. FINDINGS: Cardiovascular: There is no cardiomegaly or pericardial effusion. Coronary vascular calcification of the LAD and RCA. Mild atherosclerotic calcification of the thoracic aorta. No aneurysmal dilatation or dissection. Evaluation of the pulmonary arteries is limited due to respiratory motion artifact. No pulmonary artery embolus identified.  Mediastinum/Nodes: Mildly enlarged subcarinal lymph node measures 16 mm short axis. Top-normal bilateral hilar lymph nodes, likely reactive. The esophagus and the thyroid gland are grossly unremarkable. No mediastinal fluid collection. Lungs/Pleura: Bilateral patchy ground-glass and hazy airspace densities with peripheral and subpleural distribution most consistent with multifocal pneumonia likely viral or atypical in etiology and with pattern suggestive of COVID 19. Clinical correlation is recommended. There is no pleural effusion pneumothorax. The central airways are patent. Upper Abdomen: Indeterminate 13 mm hypodense lesion in the right lobe of the liver similar to in the right the study of 2019, likely a benign or indolent process. Clinical correlation is recommended Musculoskeletal: Degenerative changes of the spine. No acute osseous pathology. Review of the MIP images confirms the above findings. IMPRESSION: 1. No CT evidence of pulmonary embolism. 2. Multifocal pneumonia likely viral or atypical in etiology and with pattern suggestive of COVID 19. Clinical correlation is recommended. 3. Aortic Atherosclerosis (ICD10-I70.0). Electronically Signed   By: Elgie Collard M.D.   On: 06/29/2020 21:20   DG Chest Port 1 View  Result Date: 06/29/2020 CLINICAL DATA:  Shortness of breath. EXAM: PORTABLE CHEST 1 VIEW COMPARISON:  October 13, 2015 FINDINGS: The heart, hila, and mediastinum are normal. No pneumothorax. Mild opacity in the left base. Mild patchy opacity in the right base. No other acute abnormalities. IMPRESSION: Mild bibasilar opacities may represent atelectasis or developing infiltrates. Recommend clinical correlation and short-term follow-up imaging to ensure resolution. Electronically Signed   By: Gerome Sam III M.D   On: 06/29/2020 16:30     Huey Bienenstock M.D on 07/01/2020 at 12:04 PM    [facility] -  Office  [phone]

## 2020-07-02 LAB — COMPREHENSIVE METABOLIC PANEL
ALT: 75 U/L — ABNORMAL HIGH (ref 0–44)
AST: 67 U/L — ABNORMAL HIGH (ref 15–41)
Albumin: 1.9 g/dL — ABNORMAL LOW (ref 3.5–5.0)
Alkaline Phosphatase: 51 U/L (ref 38–126)
Anion gap: 11 (ref 5–15)
BUN: 32 mg/dL — ABNORMAL HIGH (ref 8–23)
CO2: 21 mmol/L — ABNORMAL LOW (ref 22–32)
Calcium: 8.1 mg/dL — ABNORMAL LOW (ref 8.9–10.3)
Chloride: 105 mmol/L (ref 98–111)
Creatinine, Ser: 0.96 mg/dL (ref 0.61–1.24)
GFR calc Af Amer: >60 mL/min (ref >60)
GFR calc non Af Amer: >60 mL/min (ref >60)
Glucose, Bld: 216 mg/dL — ABNORMAL HIGH (ref 70–99)
Potassium: 4.7 mmol/L (ref 3.5–5.1)
Sodium: 137 mmol/L (ref 135–145)
Total Bilirubin: 0.7 mg/dL (ref 0.3–1.2)
Total Protein: 5 g/dL — ABNORMAL LOW (ref 6.5–8.1)

## 2020-07-02 LAB — CBC
HCT: 36.2 % — ABNORMAL LOW (ref 39.0–52.0)
Hemoglobin: 12.3 g/dL — ABNORMAL LOW (ref 13.0–17.0)
MCH: 30 pg (ref 26.0–34.0)
MCHC: 34 g/dL (ref 30.0–36.0)
MCV: 88.3 fL (ref 80.0–100.0)
Platelets: 180 10*3/uL (ref 150–400)
RBC: 4.1 MIL/uL — ABNORMAL LOW (ref 4.22–5.81)
RDW: 13.5 % (ref 11.5–15.5)
WBC: 11.9 10*3/uL — ABNORMAL HIGH (ref 4.0–10.5)
nRBC: 0 % (ref 0.0–0.2)

## 2020-07-02 LAB — D-DIMER, QUANTITATIVE: D-Dimer, Quant: 1.91 ug/mL-FEU — ABNORMAL HIGH (ref 0.00–0.50)

## 2020-07-02 LAB — GLUCOSE, CAPILLARY
Glucose-Capillary: 204 mg/dL — ABNORMAL HIGH (ref 70–99)
Glucose-Capillary: 230 mg/dL — ABNORMAL HIGH (ref 70–99)
Glucose-Capillary: 210 mg/dL — ABNORMAL HIGH (ref 70–99)
Glucose-Capillary: 202 mg/dL — ABNORMAL HIGH (ref 70–99)

## 2020-07-02 LAB — C-REACTIVE PROTEIN: CRP: 5.4 mg/dL — ABNORMAL HIGH (ref <1.0)

## 2020-07-02 LAB — PROCALCITONIN: Procalcitonin: 0.14 ng/mL

## 2020-07-02 MED ORDER — SENNOSIDES-DOCUSATE SODIUM 8.6-50 MG PO TABS
1.0000 | ORAL_TABLET | Freq: Two times a day (BID) | ORAL | Status: DC
  Administered 2020-07-02 – 2020-07-03 (×3): 1 via ORAL
  Filled 2020-07-02 (×5): qty 1

## 2020-07-02 MED ORDER — DORZOLAMIDE HCL 2 % OP SOLN
1.0000 [drp] | Freq: Two times a day (BID) | OPHTHALMIC | Status: DC
  Administered 2020-07-02 – 2020-07-05 (×7): 1 [drp] via OPHTHALMIC
  Filled 2020-07-02: qty 10

## 2020-07-02 MED ORDER — LATANOPROST 0.005 % OP SOLN
1.0000 [drp] | Freq: Every day | OPHTHALMIC | Status: DC
  Administered 2020-07-02 – 2020-07-04 (×3): 1 [drp] via OPHTHALMIC
  Filled 2020-07-02: qty 2.5

## 2020-07-02 MED ORDER — METHYLPREDNISOLONE SODIUM SUCC 125 MG IJ SOLR
60.0000 mg | Freq: Two times a day (BID) | INTRAMUSCULAR | Status: DC
  Administered 2020-07-02 – 2020-07-03 (×2): 60 mg via INTRAVENOUS
  Filled 2020-07-02 (×2): qty 2

## 2020-07-02 MED ORDER — POLYETHYLENE GLYCOL 3350 17 G PO PACK
17.0000 g | PACK | Freq: Once | ORAL | Status: AC
  Administered 2020-07-02: 17 g via ORAL
  Filled 2020-07-02: qty 1

## 2020-07-02 MED ORDER — INSULIN DETEMIR 100 UNIT/ML ~~LOC~~ SOLN
5.0000 [IU] | Freq: Every day | SUBCUTANEOUS | Status: DC
  Administered 2020-07-02 – 2020-07-05 (×4): 5 [IU] via SUBCUTANEOUS
  Filled 2020-07-02 (×4): qty 0.05

## 2020-07-02 NOTE — Progress Notes (Signed)
PROGRESS NOTE                                                                                                                                                                                                             Patient Demographics:    Andre Friedman, is a 74 y.o. male, DOB - May 06, 1946, [id number]  Admit date - 06/29/2020   Admitting Physician Gery Pray, MD  Outpatient Primary MD for the patient is Drosinis, Leonia Reader, PA-C  LOS - 2   Chief Complaint  Patient presents with  . Shortness of Breath       Brief Narrative    74 year old male with past medical history of atrial fibrillation, diabetes mellitus, hypertension, prostate cancer, patient with recent trip to [place], where he was diagnosed with COVID-19 two weeks ago, patient presents to ED secondary to significant weakness, shortness of breath and cough, noted to be hypoxic, he was admitted for further work-up. -He is unvaccinated.   Subjective:    Andre Friedman today reports constipation, nasal congestion, cough and dyspnea.    Assessment  & Plan :    Principal Problem:   Acute respiratory failure due to COVID-19 [facility]) Active Problems:   Essential hypertension   Atrial fibrillation (HCC)   AKI (acute kidney injury) (HCC)   Syncope and collapse   Acute respiratory failure with hypoxia (HCC)  Acute hypoxic respiratory failure due to COVID-19 pneumonia -Patient is unvaccinated -Patient on 2 L nasal cannula, CT chest on admission significant for multifocal bilateral opacity. -Continue with IV steroids. -Continue with IV remdesivir. -He remains  is on 2 L, no indication for baricitinib. -Continue to trend inflammatory markers,they are significantly elevated, but trending down. -Discussed with patient, he was encouraged with incentive spirometry, flutter valve, out of bed to chair. -Elevated D-dimers, trending down, CTA chest negative for PE    COVID-19 Labs  Recent  Labs    06/29/20 1617 06/29/20 1617 06/30/20 0526 07/01/20 0734 07/02/20 0516  DDIMER 3.38*   < > 2.62* 1.58* 1.91*  FERRITIN 3,472*  --  2,659*  --   --   LDH 472*  --   --   --   --   CRP 12.4*  --   --  10.5* 5.4*   < > = values in this interval not displayed.    No  results found for: SARSCOV2NAA  Paroxysmal A. Fib -He is on flecainide for heart rate control, blood pressure remains soft, will continue to hold Coreg, continue with aspirin -Patient not on anticoagulation, by reviewing his  primary cardiologist note he is not on anticoagulation given last A. fib episode was many years ago, will not do any changes currently to his primary cardiologist plan.  diabetes mellitus -With A1c of 6.7, this is likely worsened due to steroids as well, continue with sliding scale, added on Tradjenta, will add low-dose Levemir today. Transaminitis -The setting of Covid infection, trending down.  Hypertension -Blood pressure is low, will discontinue Coreg and lisinopril.  AKI -Resolved with IV fluids  Glaucoma: -Continue with home medications  Start on some stool softener given constipation  Code Status : Full  Family Communication  : Wife updated by phone today.  Disposition Plan  :  Status is: Inpatient  Remains inpatient appropriate because:Hemodynamically unstable   Dispo: The patient is from: Home              Anticipated d/c is to: Home              Anticipated d/c date is: 3 days              Patient currently is not medically stable to d/c.      Consults  :  None  Procedures  : none  DVT Prophylaxis  :  [place] lovenox  Lab Results  Component Value Date   PLT 180 07/02/2020    Antibiotics  :    Anti-infectives (From admission, onward)   Start     Dose/Rate Route Frequency Ordered Stop   06/30/20 1000  remdesivir 100 mg in sodium chloride 0.9 % 100 mL IVPB       "Followed by" Linked Group Details   100 mg 200 mL/hr over 30 Minutes Intravenous Daily  06/29/20 2133 07/04/20 0959   06/29/20 2145  cefTRIAXone (ROCEPHIN) 2 g in sodium chloride 0.9 % 100 mL IVPB        2 g 200 mL/hr over 30 Minutes Intravenous  Once 06/29/20 2130 06/29/20 2337   06/29/20 2145  azithromycin (ZITHROMAX) 500 mg in sodium chloride 0.9 % 250 mL IVPB        500 mg 250 mL/hr over 60 Minutes Intravenous  Once 06/29/20 2130 06/30/20 0012   06/29/20 2145  remdesivir 200 mg in sodium chloride 0.9% 250 mL IVPB       "Followed by" Linked Group Details   200 mg 580 mL/hr over 30 Minutes Intravenous Once 06/29/20 2133 06/30/20 0012        Objective:   Vitals:   07/01/20 0834 07/01/20 1622 07/01/20 2046 07/02/20 0812  BP: (!) 90/59 (!) 84/40 110/71 105/75  Pulse: 67 76 77 70  Resp: 16  20 20   Temp: (!) 97.5 F (36.4 C) 97.9 F (36.6 C) 98.3 F (36.8 C) (!) 97.5 F (36.4 C)  TempSrc:      SpO2: 90% 90% 94% 92%  Weight:      Height:        Wt Readings from Last 3 Encounters:  06/29/20 77.1 kg  10/06/18 71.7 kg  09/15/18 72.6 kg    No intake or output data in the 24 hours ending 07/02/20 1031   Physical Exam  Awake Alert, Oriented X 3, No new F.N deficits, Normal affect Symmetrical Chest wall movement, Good air movement bilaterally, CTAB RRR,No Gallops,Rubs or new Murmurs, No Parasternal  Heave +ve B.Sounds, Abd Soft, No tenderness, No rebound - guarding or rigidity. No Cyanosis, Clubbing or edema, No new Rash or bruise       Data Review:    CBC Recent Labs  Lab 06/29/20 1835 06/30/20 0526 07/01/20 0734 07/02/20 0516  WBC 7.5 5.3 9.5 11.9*  HGB 13.1 12.1* 12.0* 12.3*  HCT 39.9 36.7* 35.6* 36.2*  PLT 117* 115* 159 180  MCV 89.5 89.5 88.1 88.3  MCH 29.4 29.5 29.7 30.0  MCHC 32.8 33.0 33.7 34.0  RDW 13.8 13.7 13.5 13.5  LYMPHSABS 0.5*  --   --   --   MONOABS 0.4  --   --   --   EOSABS 0.0  --   --   --   BASOSABS 0.0  --   --   --     Chemistries  Recent Labs  Lab 06/29/20 1617 06/30/20 0526 07/01/20 0734 07/02/20 0516  NA  133* 134* 136 137  K 4.0 4.5 4.3 4.7  CL 102 102 104 105  CO2 20* 22 21* 21*  GLUCOSE 190* 160* 232* 216*  BUN 28* 23 32* 32*  CREATININE 1.41* 1.04  1.04 1.06 0.96  CALCIUM 8.0* 7.9* 8.1* 8.1*  MG  --  1.9  --   --   AST 149* 104* 64* 67*  ALT 102* 81* 70* 75*  ALKPHOS 53 49 46 51  BILITOT 1.1 0.9 0.6 0.7   ------------------------------------------------------------------------------------------------------------------ Recent Labs    06/29/20 1617 06/30/20 0526  TRIG 133 84    Lab Results  Component Value Date   HGBA1C 6.7 (H) 06/30/2020   ------------------------------------------------------------------------------------------------------------------ No results for input(s): TSH, T4TOTAL, T3FREE, THYROIDAB in the last 72 hours.  Invalid input(s): FREET3 ------------------------------------------------------------------------------------------------------------------ Recent Labs    06/29/20 1617 06/30/20 0526  FERRITIN 3,472* 2,659*    Coagulation profile Recent Labs  Lab 06/30/20 0526  INR 1.3*    Recent Labs    07/01/20 0734 07/02/20 0516  DDIMER 1.58* 1.91*    Cardiac Enzymes No results for input(s): CKMB, TROPONINI, MYOGLOBIN in the last 168 hours.  Invalid input(s): CK ------------------------------------------------------------------------------------------------------------------    Component Value Date/Time   BNP 106.8 (H) 07/01/2020 0734    Inpatient Medications  Scheduled Meds: . aspirin EC  162 mg Oral QHS  . atorvastatin  40 mg Oral QHS  . dexamethasone  6 mg Oral Daily  . dorzolamide  1 drop Both Eyes BID  . enoxaparin (LOVENOX) injection  40 mg Subcutaneous Daily  . ezetimibe  10 mg Oral Daily  . flecainide  50 mg Oral BID  . glipiZIDE  5 mg Oral Q breakfast  . insulin aspart  0-5 Units Subcutaneous QHS  . insulin aspart  0-9 Units Subcutaneous TID WC  . latanoprost  1 drop Both Eyes QHS  . linagliptin  5 mg Oral Daily  .  pantoprazole  40 mg Oral Daily  . senna-docusate  1 tablet Oral BID   Continuous Infusions: . remdesivir 100 mg in NS 100 mL 100 mg (07/02/20 1016)   PRN Meds:.acetaminophen **OR** acetaminophen, chlorpheniramine-HYDROcodone, guaiFENesin, hydrALAZINE, LORazepam, ondansetron **OR** ondansetron (ZOFRAN) IV, polyethylene glycol  Micro Results Recent Results (from the past 240 hour(s))  Blood Culture (routine x 2)     Status: None (Preliminary result)   Collection Time: 06/29/20  3:56 PM   Specimen: BLOOD  Result Value Ref Range Status   Specimen Description BLOOD LEFT ANTECUBITAL  Final   Special Requests   Final  BOTTLES DRAWN AEROBIC AND ANAEROBIC Blood Culture results may not be optimal due to an excessive volume of blood received in culture bottles   Culture   Final    NO GROWTH 3 DAYS Performed at [facility], [address] [place], [place] [address]    Report Status PENDING  Incomplete  Blood Culture (routine x 2)     Status: None (Preliminary result)   Collection Time: 06/29/20  6:40 PM   Specimen: BLOOD RIGHT ARM  Result Value Ref Range Status   Specimen Description BLOOD RIGHT ARM  Final   Special Requests   Final    BOTTLES DRAWN AEROBIC AND ANAEROBIC Blood Culture results may not be optimal due to an excessive volume of blood received in culture bottles   Culture   Final    NO GROWTH 3 DAYS Performed at [facility], [address] [place], [place] [address]    Report Status PENDING  Incomplete    Radiology Reports CT Angio Chest PE W and/or Wo Contrast  Result Date: 06/29/2020 CLINICAL DATA:  74 year old male with concern for pulmonary embolism. EXAM: CT ANGIOGRAPHY CHEST WITH CONTRAST TECHNIQUE: Multidetector CT imaging of the chest was performed using the standard protocol during bolus administration of intravenous contrast. Multiplanar CT image reconstructions and MIPs were obtained to evaluate the vascular anatomy. CONTRAST:  100mL OMNIPAQUE  IOHEXOL 350 MG/ML SOLN COMPARISON:  Chest radiograph dated 06/29/2020 and CT dated 09/21/2018. FINDINGS: Cardiovascular: There is no cardiomegaly or pericardial effusion. Coronary vascular calcification of the LAD and RCA. Mild atherosclerotic calcification of the thoracic aorta. No aneurysmal dilatation or dissection. Evaluation of the pulmonary arteries is limited due to respiratory motion artifact. No pulmonary artery embolus identified. Mediastinum/Nodes: Mildly enlarged subcarinal lymph node measures 16 mm short axis. Top-normal bilateral hilar lymph nodes, likely reactive. The esophagus and the thyroid gland are grossly unremarkable. No mediastinal fluid collection. Lungs/Pleura: Bilateral patchy ground-glass and hazy airspace densities with peripheral and subpleural distribution most consistent with multifocal pneumonia likely viral or atypical in etiology and with pattern suggestive of COVID 19. Clinical correlation is recommended. There is no pleural effusion pneumothorax. The central airways are patent. Upper Abdomen: Indeterminate 13 mm hypodense lesion in the right lobe of the liver similar to in the right the study of 2019, likely a benign or indolent process. Clinical correlation is recommended Musculoskeletal: Degenerative changes of the spine. No acute osseous pathology. Review of the MIP images confirms the above findings. IMPRESSION: 1. No CT evidence of pulmonary embolism. 2. Multifocal pneumonia likely viral or atypical in etiology and with pattern suggestive of COVID 19. Clinical correlation is recommended. 3. Aortic Atherosclerosis (ICD10-I70.0). Electronically Signed   By: Elgie CollardArash  Radparvar M.D.   On: 06/29/2020 21:20   DG Chest Port 1 View  Result Date: 06/29/2020 CLINICAL DATA:  Shortness of breath. EXAM: PORTABLE CHEST 1 VIEW COMPARISON:  October 13, 2015 FINDINGS: The heart, hila, and mediastinum are normal. No pneumothorax. Mild opacity in the left base. Mild patchy opacity in the  right base. No other acute abnormalities. IMPRESSION: Mild bibasilar opacities may represent atelectasis or developing infiltrates. Recommend clinical correlation and short-term follow-up imaging to ensure resolution. Electronically Signed   By: Gerome Samavid  Williams III M.D   On: 06/29/2020 16:30     Huey Bienenstockawood Jerrid Forgette M.D on 07/02/2020 at 10:31 AM    [facility] -  Office  [phone]

## 2020-07-03 DIAGNOSIS — I10 Essential (primary) hypertension: Secondary | ICD-10-CM

## 2020-07-03 DIAGNOSIS — I480 Paroxysmal atrial fibrillation: Secondary | ICD-10-CM

## 2020-07-03 DIAGNOSIS — J9601 Acute respiratory failure with hypoxia: Secondary | ICD-10-CM

## 2020-07-03 DIAGNOSIS — J1282 Pneumonia due to coronavirus disease 2019: Secondary | ICD-10-CM

## 2020-07-03 LAB — GLUCOSE, CAPILLARY
Glucose-Capillary: 224 mg/dL — ABNORMAL HIGH (ref 70–99)
Glucose-Capillary: 278 mg/dL — ABNORMAL HIGH (ref 70–99)
Glucose-Capillary: 236 mg/dL — ABNORMAL HIGH (ref 70–99)
Glucose-Capillary: 281 mg/dL — ABNORMAL HIGH (ref 70–99)

## 2020-07-03 LAB — COMPREHENSIVE METABOLIC PANEL
ALT: 143 U/L — ABNORMAL HIGH (ref 0–44)
AST: 170 U/L — ABNORMAL HIGH (ref 15–41)
Albumin: 2.1 g/dL — ABNORMAL LOW (ref 3.5–5.0)
Alkaline Phosphatase: 71 U/L (ref 38–126)
Anion gap: 9 (ref 5–15)
BUN: 31 mg/dL — ABNORMAL HIGH (ref 8–23)
CO2: 23 mmol/L (ref 22–32)
Calcium: 8.2 mg/dL — ABNORMAL LOW (ref 8.9–10.3)
Chloride: 102 mmol/L (ref 98–111)
Creatinine, Ser: 1.11 mg/dL (ref 0.61–1.24)
GFR calc Af Amer: >60 mL/min (ref >60)
GFR calc non Af Amer: >60 mL/min (ref >60)
Glucose, Bld: 241 mg/dL — ABNORMAL HIGH (ref 70–99)
Potassium: 5.1 mmol/L (ref 3.5–5.1)
Sodium: 134 mmol/L — ABNORMAL LOW (ref 135–145)
Total Bilirubin: 0.8 mg/dL (ref 0.3–1.2)
Total Protein: 5.3 g/dL — ABNORMAL LOW (ref 6.5–8.1)

## 2020-07-03 LAB — CBC
HCT: 37.7 % — ABNORMAL LOW (ref 39.0–52.0)
Hemoglobin: 12.3 g/dL — ABNORMAL LOW (ref 13.0–17.0)
MCH: 28.8 pg (ref 26.0–34.0)
MCHC: 32.6 g/dL (ref 30.0–36.0)
MCV: 88.3 fL (ref 80.0–100.0)
Platelets: 168 10*3/uL (ref 150–400)
RBC: 4.27 MIL/uL (ref 4.22–5.81)
RDW: 13.6 % (ref 11.5–15.5)
WBC: 8.9 10*3/uL (ref 4.0–10.5)
nRBC: 0 % (ref 0.0–0.2)

## 2020-07-03 LAB — C-REACTIVE PROTEIN: CRP: 2.8 mg/dL — ABNORMAL HIGH (ref <1.0)

## 2020-07-03 LAB — PROCALCITONIN: Procalcitonin: 0.12 ng/mL

## 2020-07-03 LAB — D-DIMER, QUANTITATIVE: D-Dimer, Quant: 1.75 ug/mL-FEU — ABNORMAL HIGH (ref 0.00–0.50)

## 2020-07-03 MED ORDER — FUROSEMIDE 10 MG/ML IJ SOLN
20.0000 mg | Freq: Once | INTRAMUSCULAR | Status: AC
  Administered 2020-07-03: 20 mg via INTRAVENOUS
  Filled 2020-07-03: qty 2

## 2020-07-03 MED ORDER — METHYLPREDNISOLONE SODIUM SUCC 40 MG IJ SOLR
20.0000 mg | Freq: Two times a day (BID) | INTRAMUSCULAR | Status: DC
  Administered 2020-07-03 – 2020-07-04 (×2): 20 mg via INTRAVENOUS
  Filled 2020-07-03 (×2): qty 1

## 2020-07-03 MED ORDER — HYDROCOD POLST-CPM POLST ER 10-8 MG/5ML PO SUER
5.0000 mL | Freq: Two times a day (BID) | ORAL | Status: DC | PRN
  Administered 2020-07-03: 5 mL via ORAL
  Filled 2020-07-03: qty 5

## 2020-07-03 NOTE — Progress Notes (Signed)
PROGRESS NOTE  Andre Friedman:[id number] DOB: [birth year] DOA: 06/29/2020  PCP: Brayton El, PA-C  Brief History/Interval Summary: 74 year old male with past medical history of atrial fibrillation, diabetes mellitus, hypertension, prostate cancer, patient with recent trip to [place], where he was diagnosed with COVID-19 two weeks ago, patient presents to ED secondary to significant weakness, shortness of breath and cough, noted to be hypoxic, he was admitted for further work-up. He is unvaccinated.  Reason for Visit: Pneumonia due to COVID-19.  Acute respiratory failure with hypoxia  Consultants: None  Procedures: None  Antibiotics: Anti-infectives (From admission, onward)   Start     Dose/Rate Route Frequency Ordered Stop   06/30/20 1000  remdesivir 100 mg in sodium chloride 0.9 % 100 mL IVPB       "Followed by" Linked Group Details   100 mg 200 mL/hr over 30 Minutes Intravenous Daily 06/29/20 2133 07/03/20 0953   06/29/20 2145  cefTRIAXone (ROCEPHIN) 2 g in sodium chloride 0.9 % 100 mL IVPB        2 g 200 mL/hr over 30 Minutes Intravenous  Once 06/29/20 2130 06/29/20 2337   06/29/20 2145  azithromycin (ZITHROMAX) 500 mg in sodium chloride 0.9 % 250 mL IVPB        500 mg 250 mL/hr over 60 Minutes Intravenous  Once 06/29/20 2130 06/30/20 0012   06/29/20 2145  remdesivir 200 mg in sodium chloride 0.9% 250 mL IVPB       "Followed by" Linked Group Details   200 mg 580 mL/hr over 30 Minutes Intravenous Once 06/29/20 2133 06/30/20 0012      Subjective/Interval History: Patient states that he is feeling better.  Less short of breath compared to yesterday.  Continues to have a dry cough.  No chest pain.  No nausea vomiting.    Assessment/Plan:  Acute Hypoxic Resp. Failure/Pneumonia due to COVID-19  Recent Labs  Lab 06/29/20 1617 06/30/20 0526 07/01/20 0734 07/02/20 0516 07/03/20 0628  DDIMER 3.38* 2.62* 1.58* 1.91* 1.75*  FERRITIN 3,472* 2,659*  --   --   --    CRP 12.4*  --  10.5* 5.4* 2.8*  ALT 102* 81* 70* 75* 143*  PROCALCITON 0.28  --  0.18 0.14 0.12    Objective findings: Fever: Noted to be afebrile Oxygen requirements: Currently on 2 L saturating in the early 90s.  COVID 19 Therapeutics: Antibacterials: None.  Procalcitonin has been low. Remdesivir: Has completed course of Remdesivir Steroids: Remains on Solu-Medrol 60 mg twice a day Diuretics: None Actemra/Baricitinib: None PUD Prophylaxis: On Protonix DVT Prophylaxis:  Lovenox 40 mg daily  Respiratory standpoint patient seems to be stable.  His oxygen requirements are minimal.  He has completed course of remdesivir.  He remains on steroids.  D-dimer stable for the most part.  CRP has improved.  Continue to mobilize.  Incentive spirometry.  Prone positioning.  Try to wean him off of oxygen.  Will give 1 dose of Lasix today.  Due to moderate disease with low oxygen requirements he was not a candidate for baricitinib.  CTA was negative for PE.  The treatment plan and use of medications and known side effects were discussed with patient/family. Some of the medications used are based on case reports/anecdotal data.  All other medications being used in the management of COVID-19 based on limited study data.  Complete risks and long-term side effects are unknown, however in the best clinical judgment they seem to be of some benefit.  Patient/family wanted to  proceed with treatment options provided.  History of paroxysmal atrial fibrillation He was noted to be on flecainide which is being continued.  Carvedilol was placed on hold due to soft blood pressures.  Not noted to be on anticoagulation.  Will defer to cardiology in the outpatient setting.  Diabetes mellitus type 2, uncontrolled with hyperglycemia  Elevated CBGs due to steroids.  HbA1c 6.7.  Continue current regimen including SSI, Levemir, Tradjenta.  Also noted to be on glipizide.  Essential hypertension Holding carvedilol and  lisinopril.  Transaminitis Secondary to COVID-19.  Mild AKI Presented with a BUN of 28 and creatinine of 1.41.  He was given IV fluids with improvement in renal function.  History of glaucoma Continue with eyedrops.   DVT Prophylaxis: Lovenox Code Status: Full code Family Communication: Discussed with the patient.  We will update his wife Disposition Plan: Hopefully return home in improved  Status is: Inpatient  Remains inpatient appropriate because:IV treatments appropriate due to intensity of illness or inability to take PO and Inpatient level of care appropriate due to severity of illness   Dispo: The patient is from: Home              Anticipated d/c is to: Home              Anticipated d/c date is: 1 day              Patient currently is not medically stable to d/c.     Medications:  Scheduled: . aspirin EC  162 mg Oral QHS  . atorvastatin  40 mg Oral QHS  . dorzolamide  1 drop Both Eyes BID  . enoxaparin (LOVENOX) injection  40 mg Subcutaneous Daily  . ezetimibe  10 mg Oral Daily  . flecainide  50 mg Oral BID  . glipiZIDE  5 mg Oral Q breakfast  . insulin aspart  0-5 Units Subcutaneous QHS  . insulin aspart  0-9 Units Subcutaneous TID WC  . insulin detemir  5 Units Subcutaneous Daily  . latanoprost  1 drop Both Eyes QHS  . linagliptin  5 mg Oral Daily  . methylPREDNISolone (SOLU-MEDROL) injection  60 mg Intravenous Q12H  . pantoprazole  40 mg Oral Daily  . senna-docusate  1 tablet Oral BID   Continuous:  SNK:[id number] **OR** acetaminophen, chlorpheniramine-HYDROcodone, guaiFENesin, hydrALAZINE, LORazepam, ondansetron **OR** ondansetron (ZOFRAN) IV, polyethylene glycol   Objective:  Vital Signs  Vitals:   07/03/20 0052 07/03/20 0117 07/03/20 0240 07/03/20 0814  BP: 114/85   112/73  Pulse: 75   74  Resp: 18   (!) 21  Temp: 98.2 F (36.8 C)   97.6 F (36.4 C)  TempSrc: Oral     SpO2: 96% 93% 94% 92%  Weight:      Height:         Intake/Output Summary (Last 24 hours) at 07/03/2020 1222 Last data filed at 07/03/2020 0118 Gross per 24 hour  Intake --  Output 150 ml  Net -150 ml   Filed Weights   06/29/20 1540 06/29/20 1541  Weight: 72 kg 77.1 kg    General appearance: Awake alert.  In no distress Resp: Normal effort at rest.  Few crackles bilateral bases.  No wheezing or rhonchi. Cardio: S1-S2 is normal regular.  No S3-S4.  No rubs murmurs or bruit GI: Abdomen is soft.  Nontender nondistended.  Bowel sounds are present normal.  No masses organomegaly Extremities: No edema.  Full range of motion of lower extremities. Neurologic:  No focal neurological deficits.    Lab Results:  Data Reviewed: I have personally reviewed following labs and imaging studies  CBC: Recent Labs  Lab 06/29/20 1835 06/30/20 0526 07/01/20 0734 07/02/20 0516 07/03/20 0628  WBC 7.5 5.3 9.5 11.9* 8.9  NEUTROABS 6.5  --   --   --   --   HGB 13.1 12.1* 12.0* 12.3* 12.3*  HCT 39.9 36.7* 35.6* 36.2* 37.7*  MCV 89.5 89.5 88.1 88.3 88.3  PLT 117* 115* 159 180 168    Basic Metabolic Panel: Recent Labs  Lab 06/29/20 1617 06/30/20 0526 07/01/20 0734 07/02/20 0516 07/03/20 0628  NA 133* 134* 136 137 134*  K 4.0 4.5 4.3 4.7 5.1  CL 102 102 104 105 102  CO2 20* 22 21* 21* 23  GLUCOSE 190* 160* 232* 216* 241*  BUN 28* 23 32* 32* 31*  CREATININE 1.41* 1.04  1.04 1.06 0.96 1.11  CALCIUM 8.0* 7.9* 8.1* 8.1* 8.2*  MG  --  1.9  --   --   --     GFR: Estimated Creatinine Clearance: 56.1 mL/min (by C-G formula based on SCr of 1.11 mg/dL).  Liver Function Tests: Recent Labs  Lab 06/29/20 1617 06/30/20 0526 07/01/20 0734 07/02/20 0516 07/03/20 0628  AST 149* 104* 64* 67* 170*  ALT 102* 81* 70* 75* 143*  ALKPHOS 53 49 46 51 71  BILITOT 1.1 0.9 0.6 0.7 0.8  PROT 5.9* 5.4* 5.2* 5.0* 5.3*  ALBUMIN 2.5* 2.1* 1.9* 1.9* 2.1*     Coagulation Profile: Recent Labs  Lab 06/30/20 0526  INR 1.3*     CBG: Recent Labs   Lab 07/02/20 1247 07/02/20 1726 07/02/20 2028 07/03/20 0749 07/03/20 1138  GLUCAP 230* 210* 202* 224* 278*     Recent Results (from the past 240 hour(s))  Blood Culture (routine x 2)     Status: None (Preliminary result)   Collection Time: 06/29/20  3:56 PM   Specimen: BLOOD  Result Value Ref Range Status   Specimen Description BLOOD LEFT ANTECUBITAL  Final   Special Requests   Final    BOTTLES DRAWN AEROBIC AND ANAEROBIC Blood Culture results may not be optimal due to an excessive volume of blood received in culture bottles   Culture   Final    NO GROWTH 4 DAYS Performed at [facility], [address], [place], [place] [address]    Report Status PENDING  Incomplete  Blood Culture (routine x 2)     Status: None (Preliminary result)   Collection Time: 06/29/20  6:40 PM   Specimen: BLOOD RIGHT ARM  Result Value Ref Range Status   Specimen Description BLOOD RIGHT ARM  Final   Special Requests   Final    BOTTLES DRAWN AEROBIC AND ANAEROBIC Blood Culture results may not be optimal due to an excessive volume of blood received in culture bottles   Culture   Final    NO GROWTH 4 DAYS Performed at [facility], [address], [place], [place] [address]    Report Status PENDING  Incomplete      Radiology Studies: No results found.     LOS: 3 days   Justine Dines [employer] on [email or web address]  07/03/2020, 12:22 PM

## 2020-07-03 NOTE — Plan of Care (Signed)

## 2020-07-03 NOTE — Progress Notes (Signed)
Inpatient Diabetes Program Recommendations  AACE/ADA: New Consensus Statement on Inpatient Glycemic Control (2015)  Target Ranges:  Prepandial:   less than 140 mg/dL      Peak postprandial:   less than 180 mg/dL (1-2 hours)      Critically ill patients:  140 - 180 mg/dL   Lab Results  Component Value Date   GLUCAP 224 (H) 07/03/2020   HGBA1C 6.7 (H) 06/30/2020    Review of Glycemic Control Results for Andre Friedman, Andre Friedman (MRN [id number]) as of 07/03/2020 11:20  Ref. Range 07/02/2020 08:14 07/02/2020 12:47 07/02/2020 17:26 07/02/2020 20:28 07/03/2020 07:49  Glucose-Capillary Latest Ref Range: 70 - 99 mg/dL 840 (H) 375 (H) 436 (H) 202 (H) 224 (H)   Inpatient Diabetes Program Recommendations:   Please consider while on steroids: -Increase in Levemir to 8 units bid -Increase Novolog correction to moderate 0-15 tid + hs 0-5 units Secure chat to Dr Rito Ehrlich.  Thank you, Billy Fischer. Dawsyn Zurn, RN, MSN, CDE  Diabetes Coordinator Inpatient Glycemic Control Team Team Pager [phone] (8am-5pm) 07/03/2020 11:23 AM

## 2020-07-04 LAB — CBC
HCT: 37.6 % — ABNORMAL LOW (ref 39.0–52.0)
Hemoglobin: 12.4 g/dL — ABNORMAL LOW (ref 13.0–17.0)
MCH: 28.8 pg (ref 26.0–34.0)
MCHC: 33 g/dL (ref 30.0–36.0)
MCV: 87.2 fL (ref 80.0–100.0)
Platelets: 186 10*3/uL (ref 150–400)
RBC: 4.31 MIL/uL (ref 4.22–5.81)
RDW: 13.4 % (ref 11.5–15.5)
WBC: 9.7 10*3/uL (ref 4.0–10.5)
nRBC: 0 % (ref 0.0–0.2)

## 2020-07-04 LAB — CULTURE, BLOOD (ROUTINE X 2)
Culture: NO GROWTH
Culture: NO GROWTH

## 2020-07-04 LAB — COMPREHENSIVE METABOLIC PANEL
ALT: 147 U/L — ABNORMAL HIGH (ref 0–44)
AST: 121 U/L — ABNORMAL HIGH (ref 15–41)
Albumin: 2.1 g/dL — ABNORMAL LOW (ref 3.5–5.0)
Alkaline Phosphatase: 67 U/L (ref 38–126)
Anion gap: 11 (ref 5–15)
BUN: 34 mg/dL — ABNORMAL HIGH (ref 8–23)
CO2: 23 mmol/L (ref 22–32)
Calcium: 8.1 mg/dL — ABNORMAL LOW (ref 8.9–10.3)
Chloride: 101 mmol/L (ref 98–111)
Creatinine, Ser: 1.22 mg/dL (ref 0.61–1.24)
GFR calc Af Amer: >60 mL/min (ref >60)
GFR calc non Af Amer: 59 mL/min — ABNORMAL LOW (ref >60)
Glucose, Bld: 311 mg/dL — ABNORMAL HIGH (ref 70–99)
Potassium: 4.7 mmol/L (ref 3.5–5.1)
Sodium: 135 mmol/L (ref 135–145)
Total Bilirubin: 0.9 mg/dL (ref 0.3–1.2)
Total Protein: 5.2 g/dL — ABNORMAL LOW (ref 6.5–8.1)

## 2020-07-04 LAB — GLUCOSE, CAPILLARY
Glucose-Capillary: 225 mg/dL — ABNORMAL HIGH (ref 70–99)
Glucose-Capillary: 281 mg/dL — ABNORMAL HIGH (ref 70–99)
Glucose-Capillary: 259 mg/dL — ABNORMAL HIGH (ref 70–99)
Glucose-Capillary: 248 mg/dL — ABNORMAL HIGH (ref 70–99)

## 2020-07-04 LAB — C-REACTIVE PROTEIN: CRP: 1.2 mg/dL — ABNORMAL HIGH (ref <1.0)

## 2020-07-04 LAB — D-DIMER, QUANTITATIVE: D-Dimer, Quant: 1.99 ug/mL-FEU — ABNORMAL HIGH (ref 0.00–0.50)

## 2020-07-04 MED ORDER — PREDNISONE 20 MG PO TABS
40.0000 mg | ORAL_TABLET | Freq: Every day | ORAL | Status: DC
  Administered 2020-07-05: 40 mg via ORAL
  Filled 2020-07-04: qty 2

## 2020-07-04 MED ORDER — FUROSEMIDE 10 MG/ML IJ SOLN
20.0000 mg | Freq: Once | INTRAMUSCULAR | Status: AC
  Administered 2020-07-04: 20 mg via INTRAVENOUS
  Filled 2020-07-04: qty 2

## 2020-07-04 NOTE — Plan of Care (Signed)

## 2020-07-04 NOTE — Progress Notes (Signed)
{  OT ALL SATURATION QUALIFICATIONS: (This note is used to comply with regulatory documentation for home oxygen)  Patient Saturations on Room Air at Rest = 94%  Patient Saturations on Room Air while Ambulating = 86%  Patient Saturations on 2 Liters of oxygen while Ambulating =92%  Please briefly explain why patient needs home oxygen:Pt currently requires 2L during mobility and ADL to maintain SpO2 above 90.  Luisa Dago, OT/L   Acute OT Clinical Specialist Acute Rehabilitation Services Pager [phone] Office [phone]

## 2020-07-04 NOTE — Progress Notes (Signed)
PROGRESS NOTE  Andre Friedman BHA:[id number] DOB: 12/10/1945 DOA: 06/29/2020  PCP: Brayton El, PA-C  Brief History/Interval Summary: 74 year old male with past medical history of atrial fibrillation, diabetes mellitus, hypertension, prostate cancer, patient with recent trip to [place], where he was diagnosed with COVID-19 two weeks ago, patient presents to ED secondary to significant weakness, shortness of breath and cough, noted to be hypoxic, he was admitted for further work-up. He is unvaccinated.  Reason for Visit: Pneumonia due to COVID-19.  Acute respiratory failure with hypoxia  Consultants: None  Procedures: None  Antibiotics: Anti-infectives (From admission, onward)   Start     Dose/Rate Route Frequency Ordered Stop   06/30/20 1000  remdesivir 100 mg in sodium chloride 0.9 % 100 mL IVPB       "Followed by" Linked Group Details   100 mg 200 mL/hr over 30 Minutes Intravenous Daily 06/29/20 2133 07/03/20 0953   06/29/20 2145  cefTRIAXone (ROCEPHIN) 2 g in sodium chloride 0.9 % 100 mL IVPB        2 g 200 mL/hr over 30 Minutes Intravenous  Once 06/29/20 2130 06/29/20 2337   06/29/20 2145  azithromycin (ZITHROMAX) 500 mg in sodium chloride 0.9 % 250 mL IVPB        500 mg 250 mL/hr over 60 Minutes Intravenous  Once 06/29/20 2130 06/30/20 0012   06/29/20 2145  remdesivir 200 mg in sodium chloride 0.9% 250 mL IVPB       "Followed by" Linked Group Details   200 mg 580 mL/hr over 30 Minutes Intravenous Once 06/29/20 2133 06/30/20 0012      Subjective/Interval History: Patient not very communicative although he does say that he is feeling better.  Was able to walk to the bathroom without much difficulty.  Occasional shortness of breath.  Dry cough.  No chest pain.     Assessment/Plan:  Acute Hypoxic Resp. Failure/Pneumonia due to COVID-19  Recent Labs  Lab 06/29/20 1617 06/30/20 0526 07/01/20 0734 07/02/20 0516 07/03/20 0628  DDIMER 3.38* 2.62* 1.58* 1.91* 1.75*   FERRITIN 3,472* 2,659*  --   --   --   CRP 12.4*  --  10.5* 5.4* 2.8*  ALT 102* 81* 70* 75* 143*  PROCALCITON 0.28  --  0.18 0.14 0.12    Objective findings: Oxygen requirements: Requiring about 2 L of oxygen saturating in the early 90s.    COVID 19 Therapeutics: Antibacterials: None.  Procalcitonin has been low. Remdesivir: Has completed course of Remdesivir Steroids: Remains on Solu-Medrol 60 mg twice a day.  We will start tapering. Diuretics: None Actemra/Baricitinib: None PUD Prophylaxis: On Protonix DVT Prophylaxis:  Lovenox 40 mg daily  From a respiratory standpoint patient seems to be stable.  Ambulatory pulse oximetry to be checked to see if he is can need home oxygen.  Inflammatory markers are improved.  Patient seems to be ambulating to the bathroom without much difficulty.  Will likely need home oxygen.  He was given a dose of Lasix yesterday.  Labs are pending from today.  Might benefit from repeating another dose.  CT angiogram was negative for PE.  Discharge plan discussed with the patient.  Likely home later today or tomorrow.  The treatment plan and use of medications and known side effects were discussed with patient/family. Some of the medications used are based on case reports/anecdotal data.  All other medications being used in the management of COVID-19 based on limited study data.  Complete risks and long-term side effects are  unknown, however in the best clinical judgment they seem to be of some benefit.  Patient/family wanted to proceed with treatment options provided.  History of paroxysmal atrial fibrillation He was noted to be on flecainide which is being continued.  Carvedilol was placed on hold due to soft blood pressures.  Blood pressure has stabilized.  Continue to monitor.  Not noted to be on anticoagulation.  Will defer to cardiology in the outpatient setting.  Diabetes mellitus type 2, uncontrolled with hyperglycemia  Elevated CBGs due to steroids.  HbA1c  6.7.  Continue current regimen including SSI, Levemir, Tradjenta.  Also noted to be on glipizide.  We will start tapering steroids today's which should cause his glucose levels to improve.    Essential hypertension Holding carvedilol and lisinopril.  Transaminitis Secondary to COVID-19.  Labs are pending from today.  Mild AKI Presented with a BUN of 28 and creatinine of 1.41.  He was given IV fluids with improvement in renal function.  History of glaucoma Continue with eyedrops.   DVT Prophylaxis: Lovenox Code Status: Full code Family Communication: Discussed with the patient.  We will update his wife Disposition Plan: Hopefully return home in improved  Status is: Inpatient  Remains inpatient appropriate because:IV treatments appropriate due to intensity of illness or inability to take PO and Inpatient level of care appropriate due to severity of illness   Dispo:  Patient From: Home  Planned Disposition: Home  Expected discharge date: 07/04/20  Medically stable for discharge: No        Medications:  Scheduled: . aspirin EC  162 mg Oral QHS  . atorvastatin  40 mg Oral QHS  . dorzolamide  1 drop Both Eyes BID  . enoxaparin (LOVENOX) injection  40 mg Subcutaneous Daily  . ezetimibe  10 mg Oral Daily  . flecainide  50 mg Oral BID  . glipiZIDE  5 mg Oral Q breakfast  . insulin aspart  0-5 Units Subcutaneous QHS  . insulin aspart  0-9 Units Subcutaneous TID WC  . insulin detemir  5 Units Subcutaneous Daily  . latanoprost  1 drop Both Eyes QHS  . linagliptin  5 mg Oral Daily  . methylPREDNISolone (SOLU-MEDROL) injection  20 mg Intravenous Q12H  . pantoprazole  40 mg Oral Daily  . senna-docusate  1 tablet Oral BID   Continuous:  DJS:[id number] **OR** acetaminophen, chlorpheniramine-HYDROcodone, guaiFENesin, hydrALAZINE, LORazepam, ondansetron **OR** ondansetron (ZOFRAN) IV, polyethylene glycol   Objective:  Vital Signs  Vitals:   07/03/20 1626 07/03/20 2039  07/03/20 2200 07/04/20 0726  BP: 115/76 132/79  118/75  Pulse: 73 80  71  Resp: 17 20 17 18   Temp: 97.7 F (36.5 C) 98.2 F (36.8 C)  98.3 F (36.8 C)  TempSrc:  Oral    SpO2: 94% (!) 89% 91% 97%  Weight:      Height:        Intake/Output Summary (Last 24 hours) at 07/04/2020 1207 Last data filed at 07/04/2020 0321 Gross per 24 hour  Intake 240 ml  Output 300 ml  Net -60 ml   Filed Weights   06/29/20 1540 06/29/20 1541  Weight: 72 kg 77.1 kg    General appearance: Awake alert.  In no distress Resp: Improved effort.  Few crackles bilateral bases.  No wheezing or rhonchi.   Cardio: S1-S2 is normal regular.  No S3-S4.  No rubs murmurs or bruit GI: Abdomen is soft.  Nontender nondistended.  Bowel sounds are present normal.  No masses organomegaly Extremities:  No edema. Neurologic:   No focal neurological deficits.    Lab Results:  Data Reviewed: I have personally reviewed following labs and imaging studies  CBC: Recent Labs  Lab 06/29/20 1835 06/30/20 0526 07/01/20 0734 07/02/20 0516 07/03/20 0628  WBC 7.5 5.3 9.5 11.9* 8.9  NEUTROABS 6.5  --   --   --   --   HGB 13.1 12.1* 12.0* 12.3* 12.3*  HCT 39.9 36.7* 35.6* 36.2* 37.7*  MCV 89.5 89.5 88.1 88.3 88.3  PLT 117* 115* 159 180 168    Basic Metabolic Panel: Recent Labs  Lab 06/29/20 1617 06/30/20 0526 07/01/20 0734 07/02/20 0516 07/03/20 0628  NA 133* 134* 136 137 134*  K 4.0 4.5 4.3 4.7 5.1  CL 102 102 104 105 102  CO2 20* 22 21* 21* 23  GLUCOSE 190* 160* 232* 216* 241*  BUN 28* 23 32* 32* 31*  CREATININE 1.41* 1.04  1.04 1.06 0.96 1.11  CALCIUM 8.0* 7.9* 8.1* 8.1* 8.2*  MG  --  1.9  --   --   --     GFR: Estimated Creatinine Clearance: 56.1 mL/min (by C-G formula based on SCr of 1.11 mg/dL).  Liver Function Tests: Recent Labs  Lab 06/29/20 1617 06/30/20 0526 07/01/20 0734 07/02/20 0516 07/03/20 0628  AST 149* 104* 64* 67* 170*  ALT 102* 81* 70* 75* 143*  ALKPHOS 53 49 46 51 71  BILITOT  1.1 0.9 0.6 0.7 0.8  PROT 5.9* 5.4* 5.2* 5.0* 5.3*  ALBUMIN 2.5* 2.1* 1.9* 1.9* 2.1*     Coagulation Profile: Recent Labs  Lab 06/30/20 0526  INR 1.3*     CBG: Recent Labs  Lab 07/03/20 1138 07/03/20 1624 07/03/20 2044 07/04/20 0724 07/04/20 1201  GLUCAP 278* 236* 281* 225* 281*     Recent Results (from the past 240 hour(s))  Blood Culture (routine x 2)     Status: None   Collection Time: 06/29/20  3:56 PM   Specimen: BLOOD  Result Value Ref Range Status   Specimen Description BLOOD LEFT ANTECUBITAL  Final   Special Requests   Final    BOTTLES DRAWN AEROBIC AND ANAEROBIC Blood Culture results may not be optimal due to an excessive volume of blood received in culture bottles   Culture   Final    NO GROWTH 5 DAYS Performed at [facility], [address] [place], [place] [address]    Report Status 07/04/2020 FINAL  Final  Blood Culture (routine x 2)     Status: None   Collection Time: 06/29/20  6:40 PM   Specimen: BLOOD RIGHT ARM  Result Value Ref Range Status   Specimen Description BLOOD RIGHT ARM  Final   Special Requests   Final    BOTTLES DRAWN AEROBIC AND ANAEROBIC Blood Culture results may not be optimal due to an excessive volume of blood received in culture bottles   Culture   Final    NO GROWTH 5 DAYS Performed at [facility], [address] [place], [place] [address]    Report Status 07/04/2020 FINAL  Final      Radiology Studies: No results found.     LOS: 4 days   Dael Howland [employer] on [email or web address]  07/04/2020, 12:07 PM

## 2020-07-04 NOTE — Plan of Care (Signed)
  Problem: Education: Goal: Knowledge of General Education information will improve Description: Including pain rating scale, medication(s)/side effects and non-pharmacologic comfort measures Outcome: Progressing   Problem: Clinical Measurements: Goal: Respiratory complications will improve Outcome: Progressing   Problem: Activity: Goal: Risk for activity intolerance will decrease Outcome: Progressing  Patient able to transfer to Chair, ambulate to bathroom and ambulate in hall with standby assist. Patient on 2-3 L O2, denies SOB or dysnpea while performing ADL's. Problem: Nutrition: Goal: Adequate nutrition will be maintained Outcome: Progressing  Patient consumes >50% of meals.

## 2020-07-04 NOTE — Progress Notes (Addendum)
Occupational Therapy Evaluation Patient Details Name: Andre Friedman MRN: [id number] DOB: [birth year] Today's Date: 07/04/2020    History of Present Illness 74 year old male with past medical history of atrial fibrillation, diabetes mellitus, hypertension, prostate cancer, patient with recent trip to [place], where he was diagnosed with COVID-19 two weeks ago, patient presents to ED secondary to significant weakness, shortness of breath and cough, noted to be hypoxic, he was admitted for further work-up. He is unvaccinated.  Pneumonia due to COVID-19.  Acute respiratory failure with hypoxia   Clinical Impression   PTA, pt independent with ADL and mobility and IADL tasks. He enjoys going to [employer] with his grand children. Completes ADL tasks with overall set up however would benefit form education on energy conservation strategies. Educated pt on importance of increasing mobility and spending more time OOB in recliner and need to prone when able. Pt verbalized understanding. Able to ambulate @ 60 ft on RA with SpO2 remaining above 90. Dropped to 86 while standing at sink for ADL momentarily then rebound quickly to 91 with pursed lip breathing. Maintained above 91 on 2L. No significant DOE with mobility.  Will follow acutely to facilitate safe DC home. Pt appreciative.   Recommend pt ambulate with staff in room.   Follow Up Recommendations  No OT follow up;Supervision - Intermittent    Equipment Recommendations  None recommended by OT    Recommendations for Other Services PT consult     Precautions / Restrictions Precautions Precautions: Fall Restrictions Weight Bearing Restrictions: No      Mobility Bed Mobility Overal bed mobility: Modified Independent                Transfers Overall transfer level: Needs assistance   Transfers: Sit to/from Stand Sit to Stand: Supervision              Balance Overall balance assessment: No apparent balance deficits (not formally  assessed)                                         ADL either performed or assessed with clinical judgement   ADL Overall ADL's : Needs assistance/impaired                                     Functional mobility during ADLs: Supervision/safety General ADL Comments: completing ADL tasks with set up/supervision. Would benefit from education on energy conservation     Vision Baseline Vision/History: Wears glasses       Perception     Praxis      Pertinent Vitals/Pain Pain Assessment: No/denies pain     Hand Dominance Right   Extremity/Trunk Assessment Upper Extremity Assessment Upper Extremity Assessment: Generalized weakness   Lower Extremity Assessment Lower Extremity Assessment: Defer to PT evaluation   Cervical / Trunk Assessment Cervical / Trunk Assessment: Normal   Communication Communication Communication: No difficulties   Cognition Arousal/Alertness: Awake/alert Behavior During Therapy: WFL for tasks assessed/performed Overall Cognitive Status: No family/caregiver present to determine baseline cognitive functioning                                 General Comments: slow processing   General Comments       Exercises Exercises: Other exercises Other Exercises  Other Exercises: chair marching Other Exercises: pursed lip breathing Other Exercises: incnetive spirometer x 5 - able to pull 1250 ml   Shoulder Instructions      Home Living Family/patient expects to be discharged to:: Private residence Living Arrangements: Spouse/significant other Available Help at Discharge: Family;Available 24 hours/day (wife had Covid as well) Type of Home: House Home Access: Stairs to enter [employer] of Steps: 3   Home Layout: Two level;1/2 bath on main level (plans to stay on main level in den)     Bathroom Shower/Tub: Tub/shower unit;Walk-in shower   Bathroom Toilet: Standard Bathroom Accessibility:  Yes How Accessible: Accessible via walker Home Equipment: Shower seat          Prior Functioning/Environment Level of Independence: Independent                 OT Problem List: Decreased strength;Decreased activity tolerance;Decreased knowledge of use of DME or AE;Cardiopulmonary status limiting activity      OT Treatment/Interventions: Self-care/ADL training;Therapeutic exercise;Neuromuscular education;Energy conservation;DME and/or AE instruction;Therapeutic activities;Patient/family education    OT Goals(Current goals can be found in the care plan section) Acute Rehab OT Goals Patient Stated Goal: to get better adn go home OT Goal Formulation: With patient Time For Goal Achievement: 07/18/20 Potential to Achieve Goals: Good  OT Frequency: Min 2X/week   Barriers to D/C:            Co-evaluation              AM-PAC OT "6 Clicks" Daily Activity     Outcome Measure Help from another person eating meals?: None Help from another person taking care of personal grooming?: A Little Help from another person toileting, which includes using toliet, bedpan, or urinal?: A Little Help from another person bathing (including washing, rinsing, drying)?: A Little Help from another person to put on and taking off regular upper body clothing?: A Little Help from another person to put on and taking off regular lower body clothing?: A Little 6 Click Score: 19   End of Session Equipment Utilized During Treatment: Oxygen (0-2L) Nurse Communication: Mobility status;Other (comment) (need for flutter valve per orders)  Activity Tolerance: Patient tolerated treatment well Patient left: in chair;with call bell/phone within reach  OT Visit Diagnosis: Unsteadiness on feet (R26.81);Muscle weakness (generalized) (M62.81)                Time: 8242-3536 OT Time Calculation (min): 36 min Charges:  OT General Charges $OT Visit: 1 Visit OT Evaluation $OT Eval Moderate Complexity: 1  Mod OT Treatments $Self Care/Home Management : 8-22 mins  Luisa Dago, OT/L   Acute OT Clinical Specialist Acute Rehabilitation Services Pager [phone] Office [phone]   [facility] 07/04/2020, 12:51 PM

## 2020-07-04 NOTE — Evaluation (Signed)
Physical Therapy Evaluation Patient Details Name: Andre Friedman MRN: [id number] DOB: 12-14-1945 Today's Date: 07/04/2020   History of Present Illness  74 year old male with past medical history of atrial fibrillation, diabetes mellitus, hypertension, prostate cancer, patient with recent trip to [place], where he was diagnosed with COVID-19 two weeks ago, patient presents to ED secondary to significant weakness, shortness of breath and cough, noted to be hypoxic, he was admitted for further work-up. He is unvaccinated.  Pneumonia due to COVID-19.  Acute respiratory failure with hypoxia  Clinical Impression   Pt presents with generalized weakness, mild back pain secondary to decreased mobility, dyspnea on exertion, increased time and effort to perform mobility tasks, impaired dynamic standing balance, and decreased activity tolerance vs baseline. Pt to benefit from acute PT to address deficits. Pt ambulated hallway distance, mild unsteadiness noted when PT challenged dynamic standing. Pt with mild DOE, recovered well with rest. PT anticipates no follow up PT needs. PT to progress mobility as tolerated, and will continue to follow acutely.      Follow Up Recommendations Supervision for mobility/OOB    Equipment Recommendations  None recommended by PT    Recommendations for Other Services       Precautions / Restrictions Precautions Precautions: Fall Restrictions Weight Bearing Restrictions: No      Mobility  Bed Mobility Overal bed mobility: Modified Independent             General bed mobility comments: up in chair upon arrival to room, mod I for increased time for return to supine.  Transfers Overall transfer level: Needs assistance Equipment used: None Transfers: Sit to/from Stand Sit to Stand: Supervision         General transfer comment: for safety  Ambulation/Gait Ambulation/Gait assistance: Min guard Gait Distance (Feet): 320 Feet Assistive device: None Gait  Pattern/deviations: Step-through pattern;Decreased stride length;Shuffle Gait velocity: decr   General Gait Details: Min guard for safety, occasional unsteadiness noted when dynamic standing balance challenged (see balance section). Verbal cuing for increased foot clearance during gait. SpO2 88% and greater on 2LO2 via [place]. DOE 2/4.  Stairs            Wheelchair Mobility    Modified Rankin (Stroke Patients Only)       Balance Overall balance assessment: Needs assistance Sitting-balance support: No upper extremity supported Sitting balance-Leahy Scale: Good     Standing balance support: No upper extremity supported Standing balance-Leahy Scale: Fair Standing balance comment: able to ambulate without AD             High level balance activites: Direction changes;Head turns;Other (comment) High Level Balance Comments: Directional changes WFL; horizontal and vertical head turns, step over obstacle result in unsteadiness with recovered LOB during stepping over obstacle             Pertinent Vitals/Pain Pain Assessment: Faces Faces Pain Scale: Hurts a little bit Pain Location: back, positional Pain Descriptors / Indicators: Sore Pain Intervention(s): Limited activity within patient's tolerance;Monitored during session    Home Living Family/patient expects to be discharged to:: Private residence Living Arrangements: Spouse/significant other Available Help at Discharge: Family;Available 24 hours/day (wife had Covid as well) Type of Home: House Home Access: Stairs to enter   [employer] of Steps: 3 Home Layout: Two level;1/2 bath on main level (plans to stay on main level in den) Home Equipment: Shower seat      Prior Function Level of Independence: Independent  Hand Dominance   Dominant Hand: Right    Extremity/Trunk Assessment   Upper Extremity Assessment Upper Extremity Assessment: Defer to OT evaluation    Lower  Extremity Assessment Lower Extremity Assessment: Generalized weakness    Cervical / Trunk Assessment Cervical / Trunk Assessment: Normal  Communication   Communication: HOH  Cognition Arousal/Alertness: Awake/alert Behavior During Therapy: WFL for tasks assessed/performed Overall Cognitive Status: No family/caregiver present to determine baseline cognitive functioning                                 General Comments: slow processing, difficult to determine if this is partially due to [facility]      General Comments      Exercises Other Exercises Other Exercises: chair marching, x5 bilatearlly Other Exercises: cat/cow in seated, x5 each direction, to address back stiffness   Assessment/Plan    PT Assessment Patient needs continued PT services  PT Problem List Decreased strength;Decreased mobility;Decreased activity tolerance;Decreased balance;Pain;Cardiopulmonary status limiting activity;Decreased knowledge of precautions       PT Treatment Interventions DME instruction;Therapeutic activities;Gait training;Therapeutic exercise;Patient/family education;Balance training;Stair training;Functional mobility training    PT Goals (Current goals can be found in the Care Plan section)  Acute Rehab PT Goals Patient Stated Goal: to get better and go home PT Goal Formulation: With patient Time For Goal Achievement: 07/18/20 Potential to Achieve Goals: Good    Frequency Min 3X/week   Barriers to discharge        Co-evaluation               AM-PAC PT "6 Clicks" Mobility  Outcome Measure Help needed turning from your back to your side while in a flat bed without using bedrails?: None Help needed moving from lying on your back to sitting on the side of a flat bed without using bedrails?: None Help needed moving to and from a bed to a chair (including a wheelchair)?: None Help needed standing up from a chair using your arms (e.g., wheelchair or bedside chair)?:  None Help needed to walk in hospital room?: A Little Help needed climbing 3-5 steps with a railing? : A Little 6 Click Score: 22    End of Session Equipment Utilized During Treatment: Oxygen Activity Tolerance: Patient tolerated treatment well Patient left: in bed;with call bell/phone within reach Nurse Communication: Mobility status PT Visit Diagnosis: Other abnormalities of gait and mobility (R26.89)    Time: 1550-1605 PT Time Calculation (min) (ACUTE ONLY): 15 min   Charges:   PT Evaluation $PT Eval Low Complexity: 1 Low        Shabria Egley E, PT Acute Rehabilitation Services Pager [phone]  Office [phone]   Carols Clemence D Despina Hidden 07/04/2020, 5:03 PM

## 2020-07-05 LAB — COMPREHENSIVE METABOLIC PANEL
ALT: 151 U/L — ABNORMAL HIGH (ref 0–44)
AST: 103 U/L — ABNORMAL HIGH (ref 15–41)
Albumin: 2.4 g/dL — ABNORMAL LOW (ref 3.5–5.0)
Alkaline Phosphatase: 81 U/L (ref 38–126)
Anion gap: 9 (ref 5–15)
BUN: 41 mg/dL — ABNORMAL HIGH (ref 8–23)
CO2: 25 mmol/L (ref 22–32)
Calcium: 8.6 mg/dL — ABNORMAL LOW (ref 8.9–10.3)
Chloride: 100 mmol/L (ref 98–111)
Creatinine, Ser: 1.3 mg/dL — ABNORMAL HIGH (ref 0.61–1.24)
GFR calc Af Amer: >60 mL/min (ref >60)
GFR calc non Af Amer: 55 mL/min — ABNORMAL LOW (ref >60)
Glucose, Bld: 167 mg/dL — ABNORMAL HIGH (ref 70–99)
Potassium: 4.5 mmol/L (ref 3.5–5.1)
Sodium: 134 mmol/L — ABNORMAL LOW (ref 135–145)
Total Bilirubin: 0.8 mg/dL (ref 0.3–1.2)
Total Protein: 5.8 g/dL — ABNORMAL LOW (ref 6.5–8.1)

## 2020-07-05 LAB — GLUCOSE, CAPILLARY
Glucose-Capillary: 97 mg/dL (ref 70–99)
Glucose-Capillary: 109 mg/dL — ABNORMAL HIGH (ref 70–99)

## 2020-07-05 LAB — CBC
HCT: 42.5 % (ref 39.0–52.0)
Hemoglobin: 14 g/dL (ref 13.0–17.0)
MCH: 28.6 pg (ref 26.0–34.0)
MCHC: 32.9 g/dL (ref 30.0–36.0)
MCV: 86.7 fL (ref 80.0–100.0)
Platelets: 165 10*3/uL (ref 150–400)
RBC: 4.9 MIL/uL (ref 4.22–5.81)
RDW: 13.4 % (ref 11.5–15.5)
WBC: 8.6 10*3/uL (ref 4.0–10.5)
nRBC: 0 % (ref 0.0–0.2)

## 2020-07-05 MED ORDER — PANTOPRAZOLE SODIUM 40 MG PO TBEC: 40.0000 mg | DELAYED_RELEASE_TABLET | Freq: Every day | ORAL | 0 refills | Status: DC

## 2020-07-05 MED ORDER — PREDNISONE 20 MG PO TABS: ORAL_TABLET | ORAL | 0 refills | Status: AC

## 2020-07-05 MED ORDER — RAMIPRIL 10 MG PO CAPS: 10.0000 mg | ORAL_CAPSULE | Freq: Every day | ORAL | Status: DC

## 2020-07-05 MED ORDER — HYDROCOD POLST-CPM POLST ER 10-8 MG/5ML PO SUER: 5.0000 mL | Freq: Two times a day (BID) | ORAL | 0 refills | Status: DC | PRN

## 2020-07-05 NOTE — Progress Notes (Signed)
Nsg Discharge Note  Admit Date:  06/29/2020 Discharge date: 07/05/2020   Andre Friedman to be D/C'd Home per MD order.  AVS completed.  Copy for chart, and copy for patient signed, and dated. Patient/caregiver able to verbalize understanding.  Discharge Medication: Allergies as of 07/05/2020      Reactions   Penicillins Hives   Has patient had a PCN reaction causing immediate rash, facial/tongue/throat swelling, SOB or lightheadedness with hypotension: YES Has patient had a PCN reaction causing severe rash involving mucus membranes or skin necrosis: YES Has patient had a PCN reaction that required hospitalization NO Has patient had a PCN reaction occurring within the last 10 years: NO If all of the above answers are "NO", then may proceed with Cephalosporin use.      Medication List    TAKE these medications   aspirin EC 81 MG tablet Take 162 mg by mouth at bedtime. Notes to patient: Tonight, 07/05/20.   atorvastatin 40 MG tablet Commonly known as: LIPITOR Take 40 mg by mouth at bedtime. Notes to patient: Tonight, 04/04/20.   carvedilol 6.25 MG tablet Commonly known as: COREG Take 6.25 mg by mouth 2 (two) times daily. Notes to patient: Tonight, 07/05/20.   chlorpheniramine-HYDROcodone 10-8 MG/5ML Suer Commonly known as: TUSSIONEX Take 5 mLs by mouth every 12 (twelve) hours as needed for cough (If guaiFENesin is ineffective).   dorzolamide 2 % ophthalmic solution Commonly known as: TRUSOPT Place 1 drop into both eyes 2 (two) times daily. Notes to patient: Tonight, 07/05/20.   ezetimibe 10 MG tablet Commonly known as: ZETIA Take 10 mg by mouth daily. Notes to patient: Tomorrow, 07/06/20.   flecainide 50 MG tablet Commonly known as: TAMBOCOR Take 50 mg by mouth 2 (two) times daily. Notes to patient: Tonight, 07/05/20.   glipiZIDE 5 MG 24 hr tablet Commonly known as: GLUCOTROL XL Take 5 mg by mouth daily. Notes to patient: Tomorrow, 07/06/20.   latanoprost 0.005 % ophthalmic  solution Commonly known as: XALATAN Place 1 drop into both eyes at bedtime. Notes to patient: Tonight, 07/05/20.   metFORMIN 500 MG tablet Commonly known as: GLUCOPHAGE Take 250 mg by mouth daily. Notes to patient: Tomorrow, 07/06/20.   pantoprazole 40 MG tablet Commonly known as: PROTONIX Take 1 tablet (40 mg total) by mouth daily for 14 days. Start taking on: July 06, 2020 Notes to patient: Tomorrow, 07/06/20.   predniSONE 20 MG tablet Commonly known as: DELTASONE Take 3 tablets once daily for 3 days followed by 2 tablets once daily for 3 days followed by 1 tablet once daily for 3 days and then stop   ramipril 10 MG capsule Commonly known as: ALTACE Take 1 capsule (10 mg total) by mouth at bedtime. RESUME ONLY AFTER 7 DAYS Start taking on: July 12, 2020 What changed:   additional instructions  These instructions start on July 12, 2020. If you are unsure what to do until then, ask your doctor or other care provider. Notes to patient: Start taking daily on 07/12/20.            Durable Medical Equipment  (From admission, onward)         Start     Ordered   07/04/20 1533  For home use only DME oxygen  Once       Question Answer Comment  Length of Need 6 Months   Mode or (Route) Nasal cannula   Liters per Minute 2   Frequency Continuous (stationary and portable oxygen unit  needed)   Oxygen conserving device Yes   Oxygen delivery system Gas      07/04/20 1532          Discharge Assessment: Vitals:   07/05/20 0743 07/05/20 0800  BP: 121/81   Pulse: 70   Resp: 16 15  Temp: 97.9 F (36.6 C)   SpO2: 97%    Skin clean, dry and intact without evidence of skin break down, no evidence of skin tears noted. IV catheter discontinued intact. Site without signs and symptoms of complications - no redness or edema noted at insertion site, patient denies c/o pain - only slight tenderness at site.  Dressing with slight pressure applied.  D/c  Instructions-Education: Discharge instructions given to patient/family with verbalized understanding.  Extensive discharge education regarding management and usage of O2 systems given to patient and spouse. D/c education completed with patient/family including follow up instructions, medication list, d/c activities limitations if indicated, with other d/c instructions as indicated by MD - patient able to verbalize understanding, all questions fully answered. Patient instructed to return to ED, call 911, or call MD for any changes in condition.  Patient escorted via WC, and D/C home via private auto.  Luellen Pucker, RN 07/05/2020 7:44 PM

## 2020-07-05 NOTE — Plan of Care (Signed)
  Problem: Education: Goal: Knowledge of General Education information will improve Description: Including pain rating scale, medication(s)/side effects and non-pharmacologic comfort measures Outcome: Adequate for Discharge   Problem: Health Behavior/Discharge Planning: Goal: Ability to manage health-related needs will improve Outcome: Adequate for Discharge   Problem: Clinical Measurements: Goal: Ability to maintain clinical measurements within normal limits will improve Outcome: Adequate for Discharge Goal: Will remain free from infection Outcome: Adequate for Discharge Goal: Diagnostic test results will improve Outcome: Adequate for Discharge Goal: Respiratory complications will improve Outcome: Adequate for Discharge Goal: Cardiovascular complication will be avoided Outcome: Adequate for Discharge   Problem: Activity: Goal: Risk for activity intolerance will decrease Outcome: Adequate for Discharge  Patient verbalizes to wear O2 at all times until covid symptoms resolve. Problem: Nutrition: Goal: Adequate nutrition will be maintained Outcome: Adequate for Discharge   Problem: Coping: Goal: Level of anxiety will decrease Outcome: Adequate for Discharge   Problem: Elimination: Goal: Will not experience complications related to bowel motility Outcome: Adequate for Discharge Goal: Will not experience complications related to urinary retention Outcome: Adequate for Discharge   Problem: Pain Managment: Goal: General experience of comfort will improve Outcome: Adequate for Discharge   Problem: Safety: Goal: Ability to remain free from injury will improve Outcome: Adequate for Discharge   Problem: Skin Integrity: Goal: Risk for impaired skin integrity will decrease Outcome: Adequate for Discharge   Problem: Education: Goal: Knowledge of risk factors and measures for prevention of condition will improve Outcome: Adequate for Discharge   Problem: Coping: Goal:  Psychosocial and spiritual needs will be supported Outcome: Adequate for Discharge   Problem: Respiratory: Goal: Will maintain a patent airway Outcome: Adequate for Discharge  Extensive home oxygen education provided.  Goal: Complications related to the disease process, condition or treatment will be avoided or minimized Outcome: Adequate for Discharge

## 2020-07-05 NOTE — Plan of Care (Signed)
Problem: Education: Goal: Knowledge of General Education information will improve Description: Including pain rating scale, medication(s)/side effects and non-pharmacologic comfort measures 07/05/2020 1945 by Luellen Pucker, RN Outcome: Adequate for Discharge 07/05/2020 1829 by Luellen Pucker, RN Outcome: Adequate for Discharge   Problem: Health Behavior/Discharge Planning: Goal: Ability to manage health-related needs will improve 07/05/2020 1945 by Luellen Pucker, RN Outcome: Adequate for Discharge 07/05/2020 1829 by Luellen Pucker, RN Outcome: Adequate for Discharge   Problem: Clinical Measurements: Goal: Ability to maintain clinical measurements within normal limits will improve 07/05/2020 1945 by Luellen Pucker, RN Outcome: Adequate for Discharge 07/05/2020 1829 by Luellen Pucker, RN Outcome: Adequate for Discharge Goal: Will remain free from infection 07/05/2020 1945 by Luellen Pucker, RN Outcome: Adequate for Discharge 07/05/2020 1829 by Luellen Pucker, RN Outcome: Adequate for Discharge Goal: Diagnostic test results will improve 07/05/2020 1945 by Luellen Pucker, RN Outcome: Adequate for Discharge 07/05/2020 1829 by Luellen Pucker, RN Outcome: Adequate for Discharge Goal: Respiratory complications will improve 07/05/2020 1945 by Luellen Pucker, RN Outcome: Adequate for Discharge 07/05/2020 1829 by Luellen Pucker, RN Outcome: Adequate for Discharge Goal: Cardiovascular complication will be avoided 07/05/2020 1945 by Luellen Pucker, RN Outcome: Adequate for Discharge 07/05/2020 1829 by Luellen Pucker, RN Outcome: Adequate for Discharge   Problem: Activity: Goal: Risk for activity intolerance will decrease 07/05/2020 1945 by Luellen Pucker, RN Outcome: Adequate for Discharge 07/05/2020 1829 by Luellen Pucker, RN Outcome: Adequate for Discharge   Problem: Nutrition: Goal: Adequate nutrition will be maintained 07/05/2020 1945 by Luellen Pucker, RN Outcome: Adequate for Discharge 07/05/2020 1829  by Luellen Pucker, RN Outcome: Adequate for Discharge   Problem: Coping: Goal: Level of anxiety will decrease 07/05/2020 1945 by Luellen Pucker, RN Outcome: Adequate for Discharge 07/05/2020 1829 by Luellen Pucker, RN Outcome: Adequate for Discharge   Problem: Elimination: Goal: Will not experience complications related to bowel motility 07/05/2020 1945 by Luellen Pucker, RN Outcome: Adequate for Discharge 07/05/2020 1829 by Luellen Pucker, RN Outcome: Adequate for Discharge Goal: Will not experience complications related to urinary retention 07/05/2020 1945 by Luellen Pucker, RN Outcome: Adequate for Discharge 07/05/2020 1829 by Luellen Pucker, RN Outcome: Adequate for Discharge   Problem: Pain Managment: Goal: General experience of comfort will improve 07/05/2020 1945 by Luellen Pucker, RN Outcome: Adequate for Discharge 07/05/2020 1829 by Luellen Pucker, RN Outcome: Adequate for Discharge   Problem: Safety: Goal: Ability to remain free from injury will improve 07/05/2020 1945 by Luellen Pucker, RN Outcome: Adequate for Discharge 07/05/2020 1829 by Luellen Pucker, RN Outcome: Adequate for Discharge   Problem: Skin Integrity: Goal: Risk for impaired skin integrity will decrease 07/05/2020 1945 by Luellen Pucker, RN Outcome: Adequate for Discharge 07/05/2020 1829 by Luellen Pucker, RN Outcome: Adequate for Discharge   Problem: Education: Goal: Knowledge of risk factors and measures for prevention of condition will improve 07/05/2020 1945 by Luellen Pucker, RN Outcome: Adequate for Discharge 07/05/2020 1829 by Luellen Pucker, RN Outcome: Adequate for Discharge   Problem: Coping: Goal: Psychosocial and spiritual needs will be supported 07/05/2020 1945 by Luellen Pucker, RN Outcome: Adequate for Discharge 07/05/2020 1829 by Luellen Pucker, RN Outcome: Adequate for Discharge   Problem: Respiratory: Goal: Will maintain a patent airway 07/05/2020 1945 by Luellen Pucker, RN Outcome: Adequate for  Discharge 07/05/2020 1829 by Luellen Pucker, RN Outcome: Adequate  for Discharge Goal: Complications related to the disease process, condition or treatment will be avoided or minimized 07/05/2020 1945 by Luellen Pucker, RN Outcome: Adequate for Discharge 07/05/2020 1829 by Luellen Pucker, RN Outcome: Adequate for Discharge   Problem: Acute Rehab OT Goals (only OT should resolve) Goal: Pt. Will Perform Lower Body Bathing Outcome: Adequate for Discharge Goal: Pt. Will Perform Lower Body Dressing Outcome: Adequate for Discharge Goal: Pt. Will Transfer To Toilet Outcome: Adequate for Discharge Goal: Pt. Will Perform Tub/Shower Transfer Outcome: Adequate for Discharge Goal: OT Additional ADL Goal #1 Outcome: Adequate for Discharge Goal: OT Additional ADL Goal #2 Outcome: Adequate for Discharge   Problem: Acute Rehab PT Goals(only PT should resolve) Goal: Pt Will Go Supine/Side To Sit Outcome: Adequate for Discharge Goal: Patient Will Transfer Sit To/From Stand Outcome: Adequate for Discharge Goal: Pt Will Ambulate Outcome: Adequate for Discharge Goal: Pt Will Go Up/Down Stairs Outcome: Adequate for Discharge Goal: Pt/caregiver will Perform Home Exercise Program Outcome: Adequate for Discharge

## 2020-07-05 NOTE — Discharge Summary (Signed)
[facility]  Physician Discharge Summary   Patient ID: Andre Friedman MRN: [id number] DOB/AGE: 1946/07/06 74 y.o.  Admit date: 06/29/2020 Discharge date: 07/05/2020  PCP: Drosinis, Leonia Reader, PA-C  DISCHARGE DIAGNOSES:  Pneumonia due to COVID-19 Acute respiratory failure with hypoxia Fibrillation Diabetes mellitus type 2, uncontrolled with hyperglycemia Essential hypertension Transaminitis due to COVID-19 Mild AKI, resolved History of glaucoma  RECOMMENDATIONS FOR OUTPATIENT FOLLOW UP: 1. Ambulatory referral to pulmonology 2. Home oxygen to be arranged. 3. Check LFTs in 2 to 3 weeks.    Home Health: None Equipment/Devices: Home oxygen  CODE STATUS: Full code  DISCHARGE CONDITION: fair  Diet recommendation: As before  INITIAL HISTORY: 74 year old male with past medical history of atrial fibrillation, diabetes mellitus, hypertension, prostate cancer, patient with recent trip to [place], where he was diagnosed with COVID-19 two weeks ago, patient presents to ED secondary to significant weakness, shortness of breath and cough, noted to be hypoxic, he was admitted for further work-up. Heis unvaccinated.   HOSPITAL COURSE:   Acute Hypoxic Resp. Failure/Pneumonia due to COVID-19 Patient was hospitalized and placed on remdesivir and steroid.  He is completed course of the Remdesivir.  Steroids will be tapered.  He did not require Actemra or baricitinib.  Inflammatory markers have improved.  Patient feels much better.  Requiring about 1 to 2 L of oxygen currently.  Home oxygen will be ordered.  Seen by PT and OT.  No need for home health at this time. Patient was also given furosemide with good diuresis.  No need to continue at home. CT angiogram was negative for PE.    The treatment plan and use of medications and known side effects were discussed with patient/family. Some of the medications used are based on case reports/anecdotal data.  All other medications being used  in the management of COVID-19 based on limited study data.  Complete risks and long-term side effects are unknown, however in the best clinical judgment they seem to be of some benefit. Patient/family wanted to proceed with treatment options provided.  History of paroxysmal atrial fibrillation Remains stable.  May resume flecainide and carvedilol at home.  Not noted to be on anticoagulation.  Will defer this to his outpatient cardiologist.    Diabetes mellitus type 2, uncontrolled with hyperglycemia  Elevated CBGs due to steroids.  HbA1c 6.7.    CBG should improve as steroid is tapered down.  He may continue with his home medication regimen.  Essential hypertension Holding carvedilol and lisinopril.  Transaminitis Secondary to COVID-19.   Can be checked in the outpatient setting in a few weeks.  Mild AKI Presented with a BUN of 28 and creatinine of 1.41.  He was given IV fluids with improvement in renal function.  History of glaucoma Continue with eyedrops.   Overall stable.  Waiting on labs to come back from today.  Should be able to go home later this afternoon.  PERTINENT LABS:  The results of significant diagnostics from this hospitalization (including imaging, microbiology, ancillary and laboratory) are listed below for reference.    Microbiology: Recent Results (from the past 240 hour(s))  Blood Culture (routine x 2)     Status: None   Collection Time: 06/29/20  3:56 PM   Specimen: BLOOD  Result Value Ref Range Status   Specimen Description BLOOD LEFT ANTECUBITAL  Final   Special Requests   Final    BOTTLES DRAWN AEROBIC AND ANAEROBIC Blood Culture results may not be optimal due to an excessive volume  of blood received in culture bottles   Culture   Final    NO GROWTH 5 DAYS Performed at [facility], [address] [place], [place] [address]    Report Status 07/04/2020 FINAL  Final  Blood Culture (routine x 2)     Status: None   Collection Time:  06/29/20  6:40 PM   Specimen: BLOOD RIGHT ARM  Result Value Ref Range Status   Specimen Description BLOOD RIGHT ARM  Final   Special Requests   Final    BOTTLES DRAWN AEROBIC AND ANAEROBIC Blood Culture results may not be optimal due to an excessive volume of blood received in culture bottles   Culture   Final    NO GROWTH 5 DAYS Performed at [facility], [address], [place], [place] [address]    Report Status 07/04/2020 FINAL  Final     Labs:    Basic Metabolic Panel: Recent Labs  Lab 06/30/20 0526 06/30/20 0526 07/01/20 0734 07/02/20 0516 07/03/20 0628 07/04/20 1332 07/05/20 1345  NA 134*   < > 136 137 134* 135 134*  K 4.5   < > 4.3 4.7 5.1 4.7 4.5  CL 102   < > 104 105 102 101 100  CO2 22   < > 21* 21* GLUCOSE 160*   < > 232* 216* 241* 311* 167*  BUN 23   < > 32* 32* 31* 34* 41*  CREATININE 1.04  1.04   < > 1.06 0.96 1.11 1.22 1.30*  CALCIUM 7.9*   < > 8.1* 8.1* 8.2* 8.1* 8.6*  MG 1.9  --   --   --   --   --   --    < > = values in this interval not displayed.   Liver Function Tests: Recent Labs  Lab 07/01/20 0734 07/02/20 0516 07/03/20 0628 07/04/20 1332 07/05/20 1345  AST 64* 67* 170* 121* 103*  ALT 70* 75* 143* 147* 151*  ALKPHOS 46 51 71 67 81  BILITOT 0.6 0.7 0.8 0.9 0.8  PROT 5.2* 5.0* 5.3* 5.2* 5.8*  ALBUMIN 1.9* 1.9* 2.1* 2.1* 2.4*   CBC: Recent Labs  Lab 06/29/20 1835 06/30/20 0526 07/01/20 0734 07/02/20 0516 07/03/20 0628 07/04/20 1332 07/05/20 1345  WBC 7.5   < > 9.5 11.9* 8.9 9.7 8.6  NEUTROABS 6.5  --   --   --   --   --   --   HGB 13.1   < > 12.0* 12.3* 12.3* 12.4* 14.0  HCT 39.9   < > 35.6* 36.2* 37.7* 37.6* 42.5  MCV 89.5   < > 88.1 88.3 88.3 87.2 86.7  PLT 117*   < > 159 180 168 186 165   < > = values in this interval not displayed.   BNP: BNP (last 3 results) Recent Labs    07/01/20 0734  BNP 106.8*    CBG: Recent Labs  Lab 07/04/20 1201 07/04/20 1615 07/04/20 2130 07/05/20 0741  07/05/20 1135  GLUCAP 281* 259* 248* 97 109*     IMAGING STUDIES CT Angio Chest PE W and/or Wo Contrast  Result Date: 06/29/2020 CLINICAL DATA:  74 year old male with concern for pulmonary embolism. EXAM: CT ANGIOGRAPHY CHEST WITH CONTRAST TECHNIQUE: Multidetector CT imaging of the chest was performed using the standard protocol during bolus administration of intravenous contrast. Multiplanar CT image reconstructions and MIPs were obtained to evaluate the vascular anatomy. CONTRAST:  OMNIPAQUE IOHEXOL 350 MG/ML  SOLN COMPARISON:  Chest radiograph dated 06/29/2020 and CT dated 09/21/2018. FINDINGS: Cardiovascular: There is no cardiomegaly or pericardial effusion. Coronary vascular calcification of the LAD and RCA. Mild atherosclerotic calcification of the thoracic aorta. No aneurysmal dilatation or dissection. Evaluation of the pulmonary arteries is limited due to respiratory motion artifact. No pulmonary artery embolus identified. Mediastinum/Nodes: Mildly enlarged subcarinal lymph node measures 16 mm short axis. Top-normal bilateral hilar lymph nodes, likely reactive. The esophagus and the thyroid gland are grossly unremarkable. No mediastinal fluid collection. Lungs/Pleura: Bilateral patchy ground-glass and hazy airspace densities with peripheral and subpleural distribution most consistent with multifocal pneumonia likely viral or atypical in etiology and with pattern suggestive of COVID 19. Clinical correlation is recommended. There is no pleural effusion pneumothorax. The central airways are patent. Upper Abdomen: Indeterminate 13 mm hypodense lesion in the right lobe of the liver similar to in the right the study of 2019, likely a benign or indolent process. Clinical correlation is recommended Musculoskeletal: Degenerative changes of the spine. No acute osseous pathology. Review of the MIP images confirms the above findings. IMPRESSION: 1. No CT evidence of pulmonary embolism. 2. Multifocal  pneumonia likely viral or atypical in etiology and with pattern suggestive of COVID 19. Clinical correlation is recommended. 3. Aortic Atherosclerosis (ICD10-I70.0). Electronically Signed   By: Elgie CollardArash  Radparvar M.D.   On: 06/29/2020 21:20   DG Chest Port 1 View  Result Date: 06/29/2020 CLINICAL DATA:  Shortness of breath. EXAM: PORTABLE CHEST 1 VIEW COMPARISON:  October 13, 2015 FINDINGS: The heart, hila, and mediastinum are normal. No pneumothorax. Mild opacity in the left base. Mild patchy opacity in the right base. No other acute abnormalities. IMPRESSION: Mild bibasilar opacities may represent atelectasis or developing infiltrates. Recommend clinical correlation and short-term follow-up imaging to ensure resolution. Electronically Signed   By: Gerome Samavid  Williams III M.D   On: 06/29/2020 16:30    DISCHARGE EXAMINATION: Vitals:   07/04/20 2057 07/05/20 0035 07/05/20 0743 07/05/20 0800  BP:  123/83 121/81   Pulse:  70 70   Resp:  18 16 15   Temp:  97.6 F (36.4 C) 97.9 F (36.6 C)   TempSrc:  Oral    SpO2: 94% 95% 97%   Weight:      Height:       General appearance: Awake alert.  In no distress Resp: Normal effort at rest.  Few crackles bilateral bases.  No wheezing or rhonchi.   Cardio: S1-S2 is normal regular.  No S3-S4.  No rubs murmurs or bruit GI: Abdomen is soft.  Nontender nondistended.  Bowel sounds are present normal.  No masses organomegaly     DISPOSITION: Home  Discharge Instructions    Ambulatory referral to Pulmonology   Complete by: As directed    In 3-4 weeks. covid f/u. Home o2   Reason for referral: Other   Call MD for:  difficulty breathing, headache or visual disturbances   Complete by: As directed    Call MD for:  extreme fatigue   Complete by: As directed    Call MD for:  persistant dizziness or light-headedness   Complete by: As directed    Call MD for:  persistant nausea and vomiting   Complete by: As directed    Call MD for:  severe uncontrolled pain    Complete by: As directed    Call MD for:  temperature >100.4   Complete by: As directed    Diet - low sodium heart healthy   Complete by:  As directed    Discharge instructions   Complete by: As directed    Stay well hydrated. Tae your medications as prescribed. Follow up with your PCP in 1 week.  COVID 19 INSTRUCTIONS  - You are felt to be stable enough to no longer require inpatient monitoring, testing, and treatment, though you will need to follow the recommendations below: - Based on the CDC's non-test criteria for ending self-isolation: You may not return to work/leave the home until at least 20 days since symptom onset AND 24 hours without a fever (without taking tylenol, ibuprofen, etc.) AND have improvement in respiratory symptoms. - Do not take NSAID medications (including, but not limited to, ibuprofen, advil, motrin, naproxen, aleve, goody's powder, etc.) - Follow up with your doctor in the next week via telehealth or seek medical attention right away if your symptoms get WORSE.    Directions for you at home:  Wear a facemask You should wear a facemask that covers your nose and mouth when you are in the same room with other people and when you visit a healthcare provider. People who live with or visit you should also wear a facemask while they are in the same room with you.  Separate yourself from other people in your home As much as possible, you should stay in a different room from other people in your home. Also, you should use a separate bathroom, if available.  Avoid sharing household items You should not share dishes, drinking glasses, cups, eating utensils, towels, bedding, or other items with other people in your home. After using these items, you should wash them thoroughly with soap and water.  Cover your coughs and sneezes Cover your mouth and nose with a tissue when you cough or sneeze, or you can cough or sneeze into your sleeve. Throw used tissues in a  lined trash can, and immediately wash your hands with soap and water for at least 20 seconds or use an alcohol-based hand rub.  Wash your [employer] your hands often and thoroughly with soap and water for at least 20 seconds. You can use an alcohol-based hand sanitizer if soap and water are not available and if your hands are not visibly dirty. Avoid touching your eyes, nose, and mouth with unwashed hands.  Directions for those who live with, or provide care at home for you:  Limit the number of people who have contact with the patient If possible, have only one caregiver for the patient. Other household members should stay in another home or place of residence. If this is not possible, they should stay in another room, or be separated from the patient as much as possible. Use a separate bathroom, if available. Restrict visitors who do not have an essential need to be in the home.  Ensure good ventilation Make sure that shared spaces in the home have good air flow, such as from an air conditioner or an opened window, weather permitting.  Wash your hands often Wash your hands often and thoroughly with soap and water for at least 20 seconds. You can use an alcohol based hand sanitizer if soap and water are not available and if your hands are not visibly dirty. Avoid touching your eyes, nose, and mouth with unwashed hands. Use disposable paper towels to dry your hands. If not available, use dedicated cloth towels and replace them when they become wet.  Wear a facemask and gloves Wear a disposable facemask at all times in the room and gloves  when you touch or have contact with the patient's blood, body fluids, and/or secretions or excretions, such as sweat, saliva, sputum, nasal mucus, vomit, urine, or feces.  Ensure the mask fits over your nose and mouth tightly, and do not touch it during use. Throw out disposable facemasks and gloves after using them. Do not reuse. Wash your hands  immediately after removing your facemask and gloves. If your personal clothing becomes contaminated, carefully remove clothing and launder. Wash your hands after handling contaminated clothing. Place all used disposable facemasks, gloves, and other waste in a lined container before disposing them with other household waste. Remove gloves and wash your hands immediately after handling these items.  Do not share dishes, glasses, or other household items with the patient Avoid sharing household items. You should not share dishes, drinking glasses, cups, eating utensils, towels, bedding, or other items with a patient who is confirmed to have, or being evaluated for, COVID-19 infection. After the person uses these items, you should wash them thoroughly with soap and water.  Wash laundry thoroughly Immediately remove and wash clothes or bedding that have blood, body fluids, and/or secretions or excretions, such as sweat, saliva, sputum, nasal mucus, vomit, urine, or feces, on them. Wear gloves when handling laundry from the patient. Read and follow directions on labels of laundry or clothing items and detergent. In general, wash and dry with the warmest temperatures recommended on the label.  Clean all areas the individual has used often Clean all touchable surfaces, such as counters, tabletops, doorknobs, bathroom fixtures, toilets, phones, keyboards, tablets, and bedside tables, every day. Also, clean any surfaces that may have blood, body fluids, and/or secretions or excretions on them. Wear gloves when cleaning surfaces the patient has come in contact with. Use a diluted bleach solution (e.g., dilute bleach with 1 part bleach and 10 parts water) or a household disinfectant with a label that says EPA-registered for coronaviruses. To make a bleach solution at home, add 1 tablespoon of bleach to 1 quart (4 cups) of water. For a larger supply, add  cup of bleach to 1 gallon (16 cups) of water. Read  labels of cleaning products and follow recommendations provided on product labels. Labels contain instructions for safe and effective use of the cleaning product including precautions you should take when applying the product, such as wearing gloves or eye protection and making sure you have good ventilation during use of the product. Remove gloves and wash hands immediately after cleaning.  Monitor yourself for signs and symptoms of illness Caregivers and household members are considered close contacts, should monitor their health, and will be asked to limit movement outside of the home to the extent possible. Follow the monitoring steps for close contacts listed on the symptom monitoring form.   If you have additional questions, contact your local health department or call the epidemiologist on call at [phone] (available 24/7). This guidance is subject to change. For the most up-to-date guidance from [facility], please refer to their website: [email or web address].hu   You were cared for by a hospitalist during your hospital stay. If you have any questions about your discharge medications or the care you received while you were in the hospital after you are discharged, you can call the unit and asked to speak with the hospitalist on call if the hospitalist that took care of you is not available. Once you are discharged, your primary care physician will handle any further medical issues. Please note that NO REFILLS for any discharge  medications will be authorized once you are discharged, as it is imperative that you return to your primary care physician (or establish a relationship with a primary care physician if you do not have one) for your aftercare needs so that they can reassess your need for medications and monitor your lab values. If you do not have a primary care physician, you can call [phone] for a physician referral.   Increase activity  slowly   Complete by: As directed        Allergies as of 07/05/2020      Reactions   Penicillins Hives   Has patient had a PCN reaction causing immediate rash, facial/tongue/throat swelling, SOB or lightheadedness with hypotension: YES Has patient had a PCN reaction causing severe rash involving mucus membranes or skin necrosis: YES Has patient had a PCN reaction that required hospitalization NO Has patient had a PCN reaction occurring within the last 10 years: NO If all of the above answers are "NO", then may proceed with Cephalosporin use.      Medication List    TAKE these medications   aspirin EC 81 MG tablet Take 162 mg by mouth at bedtime.   atorvastatin 40 MG tablet Commonly known as: LIPITOR Take 40 mg by mouth at bedtime.   carvedilol 6.25 MG tablet Commonly known as: COREG Take 6.25 mg by mouth 2 (two) times daily.   chlorpheniramine-HYDROcodone 10-8 MG/5ML Suer Commonly known as: TUSSIONEX Take 5 mLs by mouth every 12 (twelve) hours as needed for cough (If guaiFENesin is ineffective).   dorzolamide 2 % ophthalmic solution Commonly known as: TRUSOPT Place 1 drop into both eyes 2 (two) times daily.   ezetimibe 10 MG tablet Commonly known as: ZETIA Take 10 mg by mouth daily.   flecainide 50 MG tablet Commonly known as: TAMBOCOR Take 50 mg by mouth 2 (two) times daily.   glipiZIDE 5 MG 24 hr tablet Commonly known as: GLUCOTROL XL Take 5 mg by mouth daily.   latanoprost 0.005 % ophthalmic solution Commonly known as: XALATAN Place 1 drop into both eyes at bedtime.   metFORMIN 500 MG tablet Commonly known as: GLUCOPHAGE Take 250 mg by mouth daily.   pantoprazole 40 MG tablet Commonly known as: PROTONIX Take 1 tablet (40 mg total) by mouth daily for 14 days. Start taking on: July 06, 2020   predniSONE 20 MG tablet Commonly known as: DELTASONE Take 3 tablets once daily for 3 days followed by 2 tablets once daily for 3 days followed by 1 tablet once  daily for 3 days and then stop   ramipril 10 MG capsule Commonly known as: ALTACE Take 1 capsule (10 mg total) by mouth at bedtime. RESUME ONLY AFTER 7 DAYS Start taking on: July 12, 2020 What changed:   additional instructions  These instructions start on July 12, 2020. If you are unsure what to do until then, ask your doctor or other care provider.            Durable Medical Equipment  (From admission, onward)         Start     Ordered   07/04/20 1533  For home use only DME oxygen  Once       Question Answer Comment  Length of Need 6 Months   Mode or (Route) Nasal cannula   Liters per Minute 2   Frequency Continuous (stationary and portable oxygen unit needed)   Oxygen conserving device Yes   Oxygen delivery system Gas  07/04/20 1532            Follow-up Information    Drosinis, Leonia Reader, PA-C. Schedule an appointment as soon as possible for a visit in 1 week(s).   Specialty: Internal Medicine Contact information: [address] [place] [address] [phone]               TOTAL DISCHARGE TIME: 35 minutes  Mohmmad Saleeby Rito Ehrlich  [facility] Pager on [email or web address]  07/05/2020, 4:29 PM

## 2020-07-05 NOTE — Discharge Instructions (Signed)

## 2020-07-05 NOTE — TOC Transition Note (Addendum)
Transition of Care [facility]) - CM/SW Discharge Note   Patient Details  Name: Andre Friedman MRN: [id number] Date of Birth: 12/14/1945  [facility] CM/SW Contact:  Beckie Busing, RN Phone Number: [phone]  07/05/2020, 12:51 PM   Clinical Narrative:    CM consulted for patient discharging home on O2. O2 order has been called to [facility]. Portable tank to be delivered to room and  Home delivery to be scheduled per [facility]. No further needs noted at this time. CM will sign off.     Final next level of care: Home/Self Care Barriers to Discharge: No Barriers Identified   Patient Goals and CMS Choice        Discharge Placement                       Discharge Plan and Services                DME Arranged: Oxygen DME Agency: [facility] Date DME Agency Contacted: 07/05/20 Time DME Agency Contacted: 1250 Representative spoke with at DME Agency: Ian Malkin HH Arranged: NA HH Agency: NA        Social Determinants of Health (SDOH) Interventions     Readmission Risk Interventions No flowsheet data found.

## 2020-07-05 NOTE — Progress Notes (Signed)
SATURATION QUALIFICATIONS: (This note is used to comply with regulatory documentation for home oxygen)  Patient Saturations on Room Air at Rest = 88%  Patient Saturations on Room Air while Ambulating = 84%  Patient Saturations on 3 Liters of oxygen while Ambulating = 90%  Please briefly explain why patient needs home oxygen:3L O2 needed to maintain SpO2 above 90 during ADL and mobility.  Luisa Dago, OT/L   Acute OT Clinical Specialist Acute Rehabilitation Services Pager [phone] Office [phone]

## 2020-07-05 NOTE — Progress Notes (Signed)
Occupational Therapy Treatment Patient Details Name: Andre Friedman MRN: [id number] DOB: [birth year] Today's Date: 07/05/2020    History of present illness 74 year old male with past medical history of atrial fibrillation, diabetes mellitus, hypertension, prostate cancer, patient with recent trip to [place], where he was diagnosed with COVID-19 two weeks ago, patient presents to ED secondary to significant weakness, shortness of breath and cough, noted to be hypoxic, he was admitted for further work-up. He is unvaccinated.  Pneumonia due to COVID-19.  Acute respiratory failure with hypoxia   OT comments  Completed education regarding energy conservation and reducing risk of falls. Able to ambulate 200 ft on 3L with SpO2 remaining @ 90. No significant DOE. Pt anticipates DC today. Encourage pt to continue to ambulate with staff. OT signing off.   Follow Up Recommendations  No OT follow up;Supervision - Intermittent    Equipment Recommendations  None recommended by OT    Recommendations for Other Services      Precautions / Restrictions Precautions Precautions: Fall       Mobility Bed Mobility Overal bed mobility: Modified Independent                Transfers Overall transfer level: Modified independent                    Balance Overall balance assessment: Needs assistance   Sitting balance-Leahy Scale: Good       Standing balance-Leahy Scale: Fair                             ADL either performed or assessed with clinical judgement   ADL                                         General ADL Comments: Educated pt on importance of energy conservation adn completing bathing/dressing form seated position as he desats with exertion and is easily fatigued. Pt has shower seat to use and verbalizes understanding.     Vision       Perception     Praxis      Cognition Arousal/Alertness: Awake/alert Behavior During Therapy: Flat  affect Overall Cognitive Status: No family/caregiver present to determine baseline cognitive functioning                                 General Comments: slow processing        Exercises Other Exercises Other Exercises: reviewed theraband HPE   Shoulder Instructions       General Comments      Pertinent Vitals/ Pain       Pain Assessment: No/denies pain  Home Living                                          Prior Functioning/Environment              Frequency           Progress Toward Goals  OT Goals(current goals can now be found in the care plan section)  Progress towards OT goals: Goals met/education completed, patient discharged from OT (appropriate for DC)  Acute Rehab OT Goals Patient Stated Goal: to get better and go  home OT Goal Formulation: With patient Time For Goal Achievement: 07/18/20 Potential to Achieve Goals: Good ADL Goals Pt Will Perform Lower Body Bathing: with modified independence;sit to/from stand Pt Will Perform Lower Body Dressing: with modified independence;sit to/from stand Pt Will Transfer to Toilet: with modified independence;ambulating Pt Will Perform Tub/Shower Transfer: Shower transfer;with modified independence;shower seat Additional ADL Goal #1: Pt will independently verbalize 3 energy conservation strategies Additional ADL Goal #2: Pt will independently demonstrate pursed lip breathing techniques  Plan All goals met and education completed, patient discharged from OT services    Co-evaluation                 AM-PAC OT "6 Clicks" Daily Activity     Outcome Measure   Help from another person eating meals?: None Help from another person taking care of personal grooming?: A Little Help from another person toileting, which includes using toliet, bedpan, or urinal?: A Little Help from another person bathing (including washing, rinsing, drying)?: A Little Help from another person to put  on and taking off regular upper body clothing?: A Little Help from another person to put on and taking off regular lower body clothing?: A Little 6 Click Score: 19    End of Session Equipment Utilized During Treatment: Oxygen (3L)  OT Visit Diagnosis: Unsteadiness on feet (R26.81);Muscle weakness (generalized) (M62.81)   Activity Tolerance Patient tolerated treatment well   Patient Left in chair;with call bell/phone within reach   Nurse Communication Mobility status;Other (comment) (DC needs)        Time: 7711-6579 OT Time Calculation (min): 36 min  Charges: OT General Charges $OT Visit: 1 Visit OT Treatments $Self Care/Home Management : 23-37 mins  Maurie Boettcher, OT/L   Acute OT Clinical Specialist [place] Pager [phone] Office [phone]    [facility] 07/05/2020, 1:27 PM

## 2021-08-22 IMAGING — CT CT ANGIO CHEST
2 of 6 series · 18 of 46 positions shown · IV contrast (omnipaque)
Comparison: Chest radiograph dated 06/29/2020 and CT dated
09/21/2018.

CLINICAL DATA: 73-year-old male with concern for pulmonary
embolism.

EXAM:
CT ANGIOGRAPHY CHEST WITH CONTRAST
TECHNIQUE: Multidetector CT imaging of the chest was performed using the
standard protocol during bolus administration of intravenous
contrast. Multiplanar CT image reconstructions and MIPs were
obtained to evaluate the vascular anatomy.
CONTRAST:  100mL OMNIPAQUE IOHEXOL 350 MG/ML SOLN

[Series 6: thins · axial · 0.75mm/px · z∈[-354,-52]mm · 15 of 332 slices shown]
[im 15/332  lung]
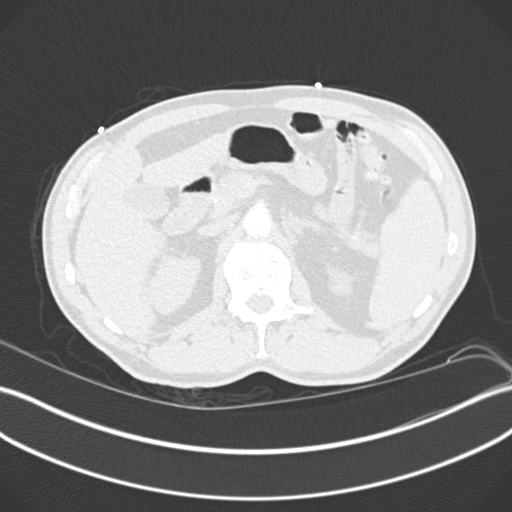
[im 44/332  soft-tissue]
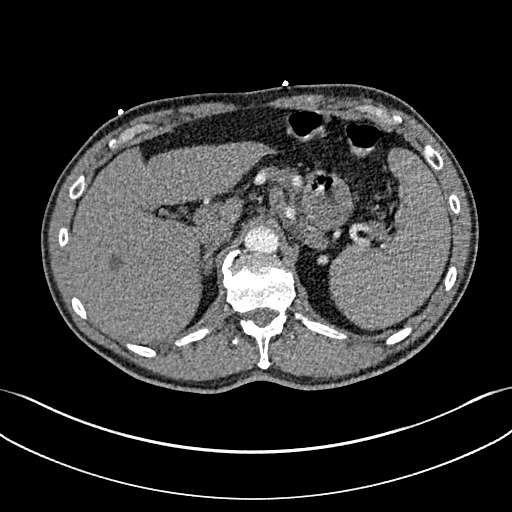
[im 58/332  lung]
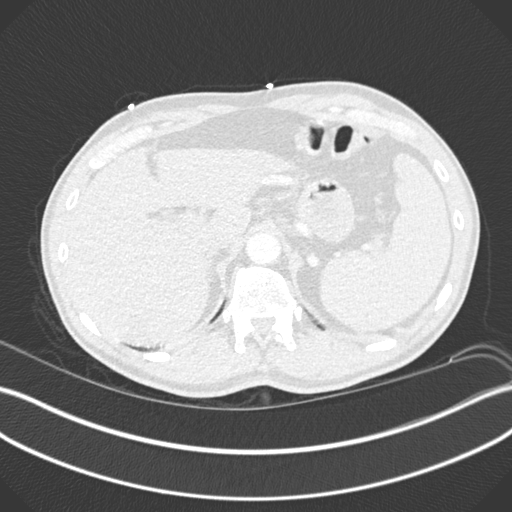
[im 87/332  soft-tissue]
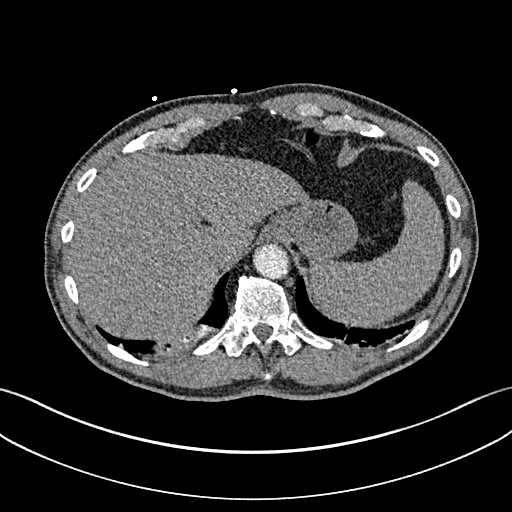
[im 101/332  lung]
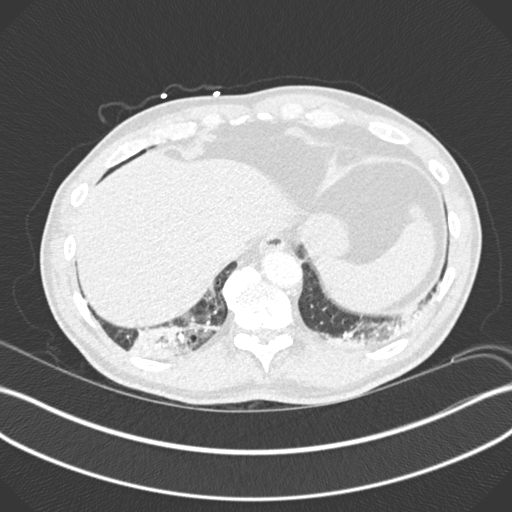
[im 130/332  soft-tissue]
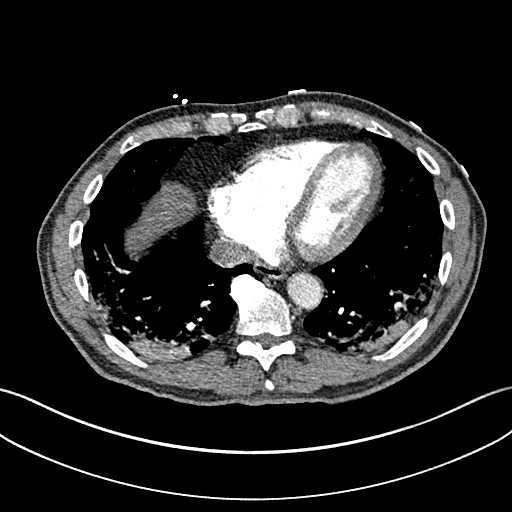
[im 144/332  lung]
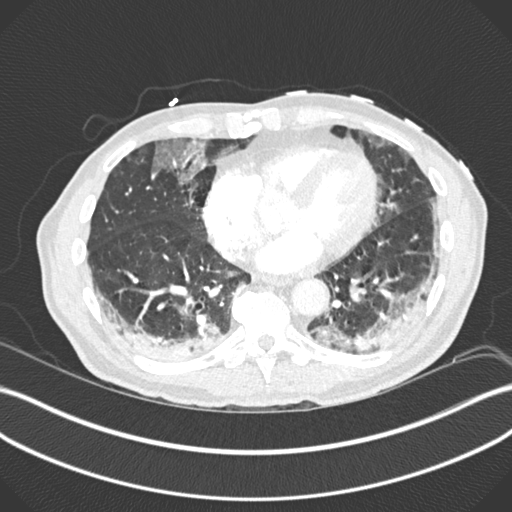
[im 173/332  soft-tissue]
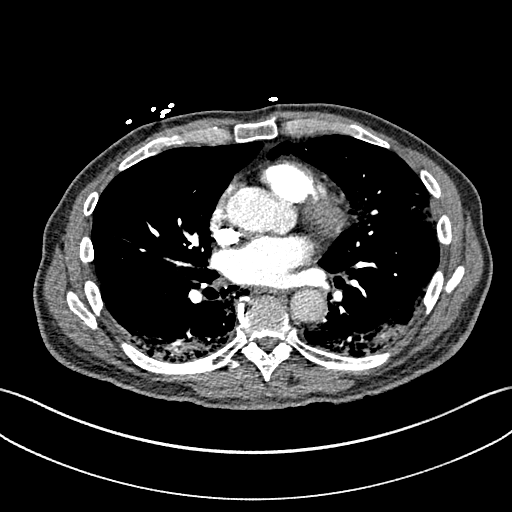
[im 188/332  lung]
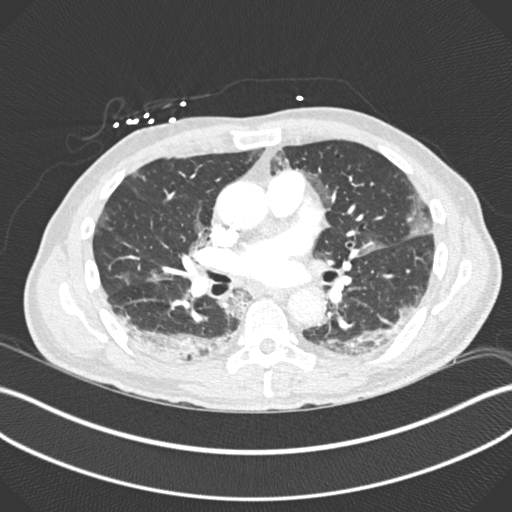
[im 202/332  soft-tissue]
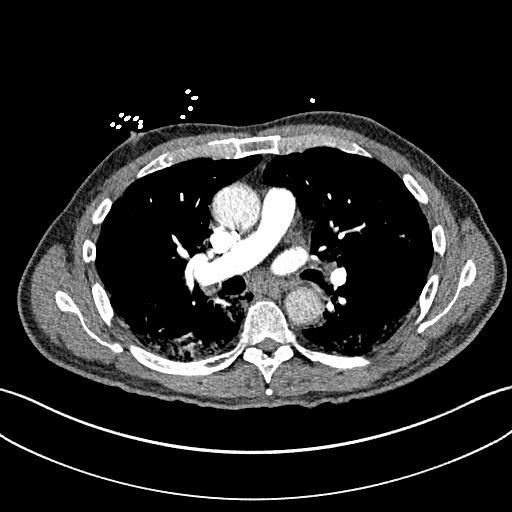
[im 231/332  lung]
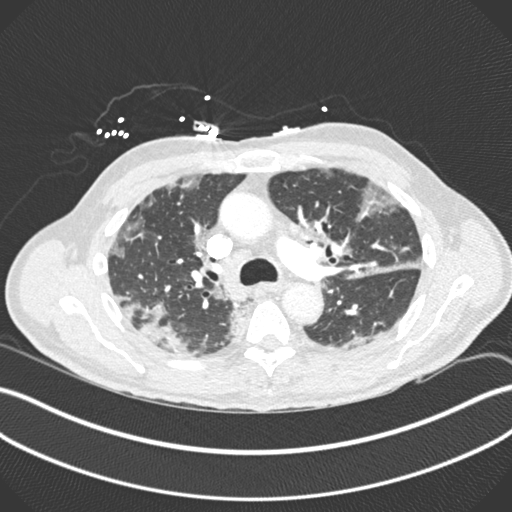
[im 245/332  soft-tissue]
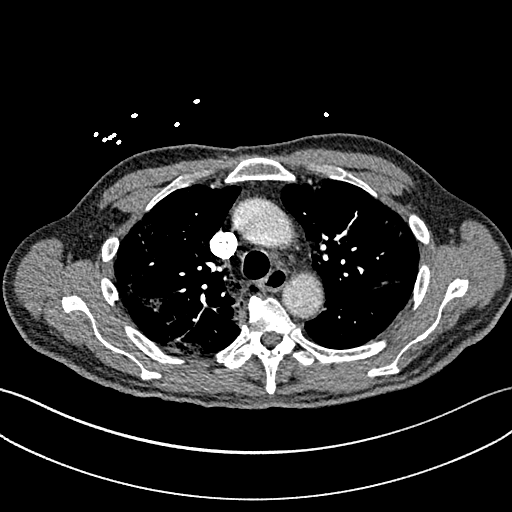
[im 274/332  lung]
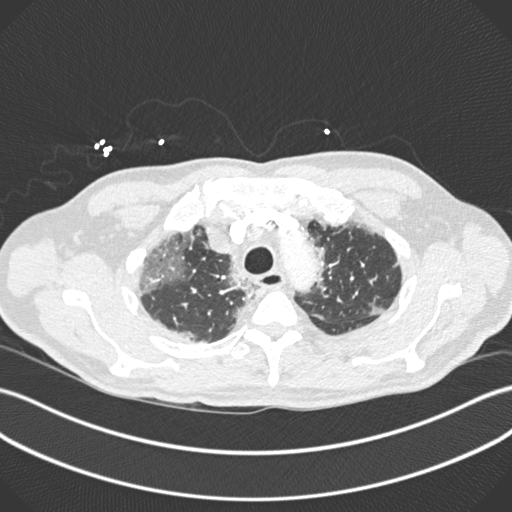
[im 288/332  soft-tissue]
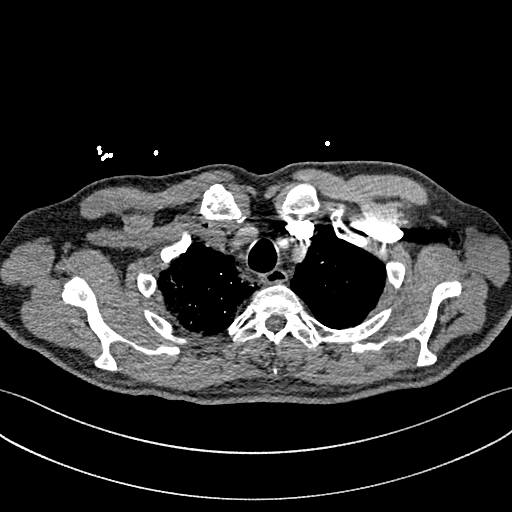
[im 317/332  lung]
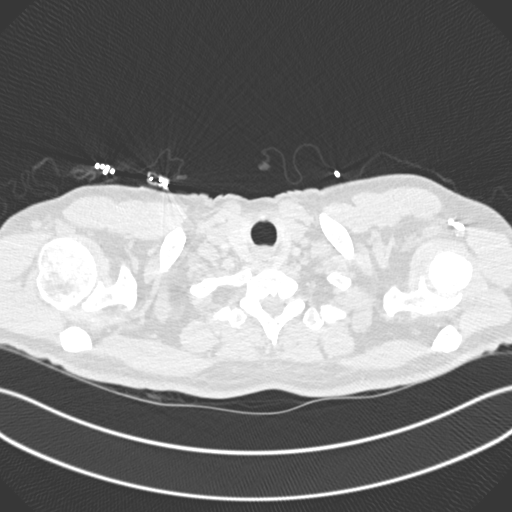

[Series 8: coronal mpr · coronal · 0.67mm/px · 3 of 146 slices shown]
[im 37/146  soft-tissue]
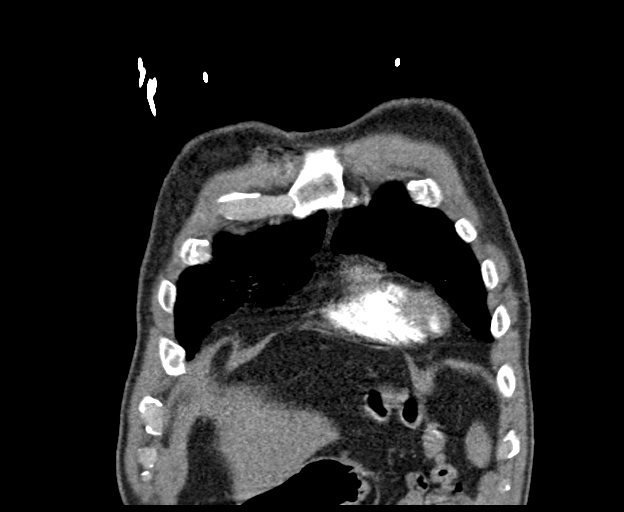
[im 73/146  soft-tissue]
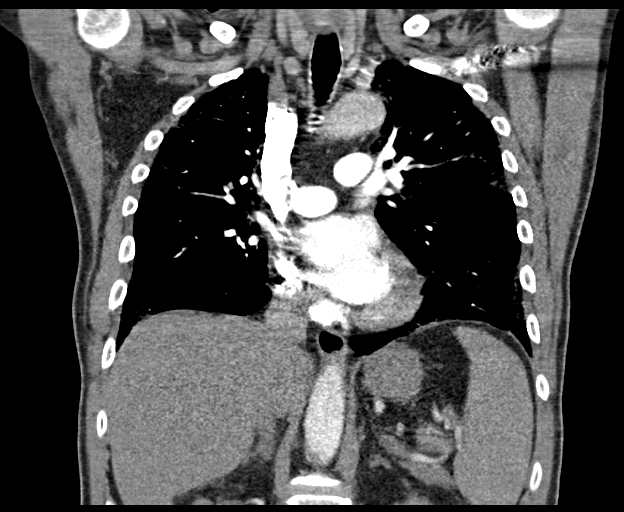
[im 109/146  soft-tissue]
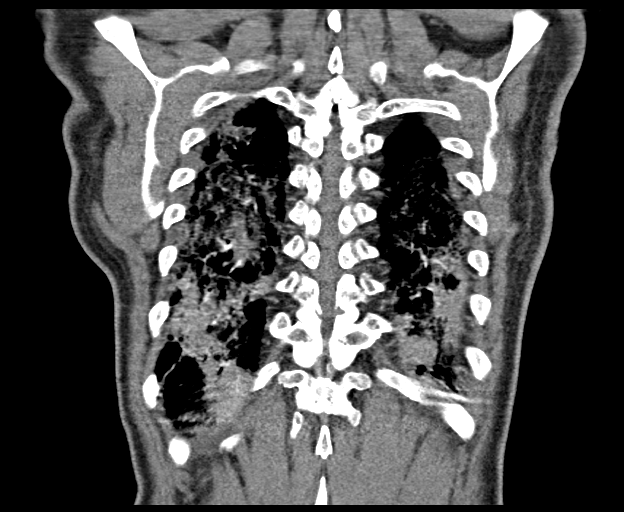

[18 of 46 positions shown; findings below may reference images not displayed]

FINDINGS: Cardiovascular: There is no cardiomegaly or pericardial effusion.
Coronary vascular calcification of the LAD and RCA. Mild
atherosclerotic calcification of the thoracic aorta. No aneurysmal
dilatation or dissection. Evaluation of the pulmonary arteries is
limited due to respiratory motion artifact. No pulmonary artery
embolus identified.

Mediastinum/Nodes: Mildly enlarged subcarinal lymph node measures 16
mm short axis. Top-normal bilateral hilar lymph nodes, likely
reactive. The esophagus and the thyroid gland are grossly
unremarkable. No mediastinal fluid collection.

Lungs/Pleura: Bilateral patchy ground-glass and hazy airspace
densities with peripheral and subpleural distribution most
consistent with multifocal pneumonia likely viral or atypical in
etiology and with pattern suggestive of COVID 19. Clinical
correlation is recommended. There is no pleural effusion
pneumothorax. The central airways are patent.

Upper Abdomen: Indeterminate 13 mm hypodense lesion in the right
lobe of the liver similar to in the right the study of 5604, likely
a benign or indolent process. Clinical correlation is recommended

Musculoskeletal: Degenerative changes of the spine. No acute osseous
pathology.

Review of the MIP images confirms the above findings.
IMPRESSION: 1. No CT evidence of pulmonary embolism.
2. Multifocal pneumonia likely viral or atypical in etiology and
with pattern suggestive of COVID 19. Clinical correlation is
recommended.
3. Aortic Atherosclerosis ([id number].X).

## 2022-05-25 ENCOUNTER — Emergency Department (HOSPITAL_COMMUNITY)
Admission: EM | Admit: 2022-05-25 | Discharge: 2022-05-25 | Disposition: A | Discharge status: Home / Self Care | Payer: Medicare Other | Attending: Emergency Medicine | Admitting: Emergency Medicine

## 2022-05-25 ENCOUNTER — Other Ambulatory Visit: Payer: Self-pay

## 2022-05-25 ENCOUNTER — Encounter (HOSPITAL_COMMUNITY): Payer: Self-pay

## 2022-05-25 DIAGNOSIS — Z87891 Personal history of nicotine dependence: Secondary | ICD-10-CM | POA: Insufficient documentation

## 2022-05-25 DIAGNOSIS — Z79899 Other long term (current) drug therapy: Secondary | ICD-10-CM | POA: Insufficient documentation

## 2022-05-25 DIAGNOSIS — Z7984 Long term (current) use of oral hypoglycemic drugs: Secondary | ICD-10-CM | POA: Diagnosis not present

## 2022-05-25 DIAGNOSIS — Z7982 Long term (current) use of aspirin: Secondary | ICD-10-CM | POA: Diagnosis not present

## 2022-05-25 DIAGNOSIS — I10 Essential (primary) hypertension: Secondary | ICD-10-CM | POA: Diagnosis not present

## 2022-05-25 DIAGNOSIS — M79642 Pain in left hand: Secondary | ICD-10-CM | POA: Diagnosis not present

## 2022-05-25 DIAGNOSIS — R200 Anesthesia of skin: Secondary | ICD-10-CM | POA: Insufficient documentation

## 2022-05-25 DIAGNOSIS — M79641 Pain in right hand: Secondary | ICD-10-CM | POA: Insufficient documentation

## 2022-05-25 DIAGNOSIS — E119 Type 2 diabetes mellitus without complications: Secondary | ICD-10-CM | POA: Insufficient documentation

## 2022-05-25 DIAGNOSIS — R202 Paresthesia of skin: Secondary | ICD-10-CM

## 2022-05-25 NOTE — Discharge Instructions (Addendum)
Continue your at home medical therapy of gabapentin.  We are limited in what we can prescribe given your chronic conditions as well as advised by your primary care provider stately from steroids and NSAIDs.  I have placed an ambulatory referral for neurology here in [place].  Hopefully you can get in sooner within your appointment in [facility].  Consider asking your primary care provider for outpatient MRI of your cervical spine so you can have the results before you see the neurologist to help expedite your treatment/care.  Please do not hesitate to return to emergency department if the worrisome signs and symptoms we discussed become apparent.

## 2022-05-25 NOTE — ED Triage Notes (Signed)
Pt reports prior injury to right arm and was given diagnosis of carpal tunnel syndrome. Pt now endorses bilateral hand pain and numbness. Pt states he has appointment with neurology in August, but states the pain is too severe to wait until then. Pt reports recently being prescribed gabapentin, which he reports is not helping.

## 2022-05-25 NOTE — ED Notes (Signed)
I provided reinforced discharge education based off of discharge summary/care provided. Pt acknowledged and understood my education. Pt had no further questions/concerns for provider/myself.   

## 2022-05-25 NOTE — ED Provider Notes (Signed)
[facility]-EMERGENCY DEPT Provider Note   CSN: [id number] Arrival date & time: 05/25/22  1621     History  Chief Complaint  Patient presents with   Hand Pain    Andre Friedman is a 76 y.o. male.   Hand Pain  76 year old male presents emergency department with complaints of bilateral hand pain and tingling.  Patient states that he has had right-sided hand numbness and tingling for the past year after trauma to his right shoulder.  He notes no change in severity of symptoms.  He is seen and an orthopedic surgeon, hand surgeon for associated symptoms and has been told there is nothing that they can do.  He has been followed by his PCP for said symptoms and has been put on gabapentin which he states now is not helping very much.  3 weeks ago he began to have pain and tingling in his left hand.  Both pain and numbness are described as isolated to the medial aspect of patient's palm with extension into fifth, fourth and medial aspect of patient's third digit.  He denies weakness in affected hands or lack of range of motion.  He has a neurology appointment for mid to late August in [facility] but states he wants to be seen sooner and in [place].  He denies fever, chills, night sweats, chest pain, shortness of breath, abdominal pain, nausea/vomit/diarrhea, urinary symptoms, change in bowel habits.  Denies known trauma to back/neck.  Denies fever, saddle anesthesia, weakness/sensory deficits in lower extremities, bowel/bladder dysfunction, history of IV drug use.  Home Medications Prior to Admission medications   Medication Sig Start Date End Date Taking? Authorizing Provider  aspirin EC 81 MG tablet Take 162 mg by mouth at bedtime.    [provider]  atorvastatin (LIPITOR) 40 MG tablet Take 40 mg by mouth at bedtime.  09/24/15   [provider]  carvedilol (COREG) 6.25 MG tablet Take 6.25 mg by mouth 2 (two) times daily. 05/25/20   [provider]   chlorpheniramine-HYDROcodone (TUSSIONEX) 10-8 MG/5ML SUER Take 5 mLs by mouth every 12 (twelve) hours as needed for cough (If guaiFENesin is ineffective). 07/05/20   Osvaldo Shipper, MD  dorzolamide (TRUSOPT) 2 % ophthalmic solution Place 1 drop into both eyes 2 (two) times daily. 04/28/20   [provider]  ezetimibe (ZETIA) 10 MG tablet Take 10 mg by mouth daily. 05/02/20   [provider]  flecainide (TAMBOCOR) 50 MG tablet Take 50 mg by mouth 2 (two) times daily.  02/18/17   [provider]  glipiZIDE (GLUCOTROL XL) 5 MG 24 hr tablet Take 5 mg by mouth daily. 05/02/20   [provider]  latanoprost (XALATAN) 0.005 % ophthalmic solution Place 1 drop into both eyes at bedtime. 01/15/20   [provider]  metFORMIN (GLUCOPHAGE) 500 MG tablet Take 250 mg by mouth daily. 04/12/20   [provider]  pantoprazole (PROTONIX) 40 MG tablet Take 1 tablet (40 mg total) by mouth daily for 14 days. 07/06/20 07/20/20  Osvaldo Shipper, MD  predniSONE (DELTASONE) 20 MG tablet Take 3 tablets once daily for 3 days followed by 2 tablets once daily for 3 days followed by 1 tablet once daily for 3 days and then stop 07/05/20   Osvaldo Shipper, MD  ramipril (ALTACE) 10 MG capsule Take 1 capsule (10 mg total) by mouth at bedtime. RESUME ONLY AFTER 7 DAYS 07/12/20   Osvaldo Shipper, MD      Allergies  Penicillins    Review of Systems   Review of Systems  Musculoskeletal:        Bilateral hand pain and tingling  All other systems reviewed and are negative.   Physical Exam Updated Vital Signs BP 140/87 (BP Location: Left Arm)   Pulse 77   Temp 98.7 F (37.1 C) (Oral)   Resp 18   SpO2 99%  Physical Exam Vitals and nursing note reviewed.  Constitutional:      General: He is not in acute distress.    Appearance: He is well-developed.  HENT:     Head: Normocephalic and atraumatic.  Eyes:     Conjunctiva/sclera: Conjunctivae normal.  Cardiovascular:     Rate and  Rhythm: Normal rate and regular rhythm.     Pulses: Normal pulses.     Heart sounds: No murmur heard.    Comments: Radial and posterior tibial pulses full and equal bilaterally. Pulmonary:     Effort: Pulmonary effort is normal. No respiratory distress.     Breath sounds: Normal breath sounds.  Abdominal:     Palpations: Abdomen is soft.     Tenderness: There is no abdominal tenderness.  Musculoskeletal:        General: No swelling.       Hands:     Cervical back: Neck supple.     Right lower leg: No edema.     Left lower leg: No edema.     Comments: Patient has full range of motion of neck with no exacerbation of symptoms.  No tender to palpation along cervical spine or paraspinal areas.  Patient has tingling/pain in areas indicated above.  No obvious atrophy of thenar or hyperthenar eminence.  Patient able to make okay sign, fist, thumbs up, resist horizontal adduction of fingers, extend wrist without difficulty bilaterally.  Radial pulses full and intact bilaterally.  Grip strength, elbow flexion, elbow extension, shoulder flexion/extension/adduction/abduction 5 out of 5 bilaterally.  No tender to palpation over affected area above.  Skin:    General: Skin is warm and dry.     Capillary Refill: Capillary refill takes less than 2 seconds.  Neurological:     Mental Status: He is alert.  Psychiatric:        Mood and Affect: Mood normal.     ED Results / Procedures / Treatments   Labs (all labs ordered are listed, but only abnormal results are displayed) Labs Reviewed - No data to display  EKG None  Radiology No results found.  Procedures Procedures    Medications Ordered in ED Medications - No data to display  ED Course/ Medical Decision Making/ A&P                           Medical Decision Making  This patient presents to the ED for concern of bilateral hand tingling/pain, this involves an extensive number of treatment options, and is a complaint that carries with  it a high risk of complications and morbidity.  The differential diagnosis includes cervical spinal fracture, cervical foraminal stenosis, peripheral nerve impingement, antecubital impingement, transverse myelitis, epidural abscess, fracture, strain/sprain, osteomyelitis   Co morbidities that complicate the patient evaluation  Diabetes mellitus, atrial fibrillation, hypertension   Additional history obtained:  Additional history obtained from [facility] from March 2023 External records from outside source obtained and reviewed including GFR greater than 30   Lab Tests:  N/a   Imaging Studies ordered:  Offered CT imaging of  patient cervical spine but he declined at this time.   Cardiac Monitoring: / EKG:  The patient was maintained on a cardiac monitor.  I personally viewed and interpreted the cardiac monitored which showed an underlying rhythm of: Sinus rhythm   Consultations Obtained:  N/a   Problem List / ED Course / Critical interventions / Medication management  Bilateral hand pain/tingling Reevaluation of the patient showed that the patient stayed the same I have reviewed the patients home medicines and have made adjustments as needed   Social Determinants of Health:  Former cigarette smoker.  Denies illicit drug use.   Test / Admission - Considered:  Bilateral hand pain/tingling Vitals signs within normal range and stable throughout visit. Imaging studies considered but patient declined secondary to being follow-up with neurology outpatient.  I recommended asking PCP for outpatient MRI with associated follow-up.  Patient requesting referral for neurology in [place] to see if he can get in sooner and has appointment made in late August/early September.  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  Unsure at this point where the exact impingement is occurring.  Patient is eliciting pain as well as tingling sensation with lack of weakness evident or  loss of strength.  MRI did not necessary at this time in the emergency department.  Patient elected not to "wait around for imaging."  Ambulatory referral for neurology to be given for [facility] follow-up.  Medicines to be used outpatient for patient's symptoms limited secondary to patient's known diabetes as well as CAD; he was told by his PCP to avoid oral prednisone as well as NSAIDs due to chronic conditions.  Patient to continue at home gabapentin until follow-up with neurology possible. Worrisome signs and symptoms were discussed with the patient, and the patient acknowledged understanding to return to the ED if noticed. Patient was stable upon discharge.         Final Clinical Impression(s) / ED Diagnoses Final diagnoses:  Right hand pain  Left hand pain  Numbness and tingling in right hand  Numbness and tingling in left hand    Rx / DC Orders ED Discharge Orders     None         Peter Garter, [place] 05/25/22 Izell Valencia    Alvira Monday, MD 05/26/22 1158

## 2022-05-25 NOTE — ED Provider Triage Note (Signed)
Emergency Medicine Provider Triage Evaluation Note  Andre Friedman , a 76 y.o. male  was evaluated in triage.  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  Patient states that a year ago, he sustained a shoulder injury and started having numbness and tingling in his right fingers shortly after that.  The numbness and tingling has now spread to include more fingers.  Recently in the last couple of weeks, he is also now experienced numbness and tingling in the left fingers as well.  He states that he talk to his PCP to give a neurology appointment and they cannot get him in until August 22.  He states that he cannot wait that long because he is having trouble opening and closing his hands.    Patient asks if we can call neurologist while he is here to ask for them to evaluate him.  Review of Systems  Positive:  Negative:   Physical Exam  BP 140/87 (BP Location: Left Arm)   Pulse 77   Temp 98.7 F (37.1 C) (Oral)   Resp 18   SpO2 99%  Gen:   Awake, no distress   Resp:  Normal effort  MSK:   Moves extremities without difficulty  Other:    Medical Decision Making  Medically screening exam initiated at 4:57 PM.  Appropriate orders placed.  Carollee Herter was informed that the remainder of the evaluation will be completed by another provider, this initial triage assessment does not replace that evaluation, and the importance of remaining in the ED until their evaluation is complete.     Janell Quiet, [place] 05/25/22 1658

## 2024-10-02 ENCOUNTER — Ambulatory Visit: Attending: Cardiology | Admitting: Cardiology

## 2024-10-02 ENCOUNTER — Encounter: Payer: Self-pay | Admitting: Cardiology

## 2024-10-02 VITALS — BP 149/76 | HR 66 | Resp 16 | Ht 66.0 in | Wt 153.2 lb

## 2024-10-02 DIAGNOSIS — Z8679 Personal history of other diseases of the circulatory system: Secondary | ICD-10-CM | POA: Diagnosis not present

## 2024-10-02 DIAGNOSIS — I2510 Atherosclerotic heart disease of native coronary artery without angina pectoris: Secondary | ICD-10-CM | POA: Diagnosis not present

## 2024-10-02 DIAGNOSIS — I2584 Coronary atherosclerosis due to calcified coronary lesion: Secondary | ICD-10-CM

## 2024-10-02 DIAGNOSIS — E7800 Pure hypercholesterolemia, unspecified: Secondary | ICD-10-CM | POA: Diagnosis not present

## 2024-10-02 DIAGNOSIS — E119 Type 2 diabetes mellitus without complications: Secondary | ICD-10-CM | POA: Insufficient documentation

## 2024-10-02 DIAGNOSIS — I10 Essential (primary) hypertension: Secondary | ICD-10-CM

## 2024-10-02 MED ORDER — EZETIMIBE 10 MG PO TABS: 10.0000 mg | ORAL_TABLET | Freq: Every day | ORAL | 0 refills | Status: AC

## 2024-10-02 MED ORDER — OLMESARTAN MEDOXOMIL-HCTZ 40-25 MG PO TABS: 1.0000 | ORAL_TABLET | ORAL | 2 refills | Status: DC

## 2024-10-02 NOTE — Progress Notes (Signed)
 Cardiology Office Note:  .   Date:  10/02/2024  ID:  Andre Friedman, DOB Apr 17, 1946, MRN [id number] PCP: Joshua Franky Ozell [name]  [facility] Providers Cardiologist:  None   History of Present Illness: .   Andre Friedman is a 78 y.o. Caucasian male patient with primary hypertension, hypercholesterolemia, type 2 diabetes without complications, h/o tobacco use disorder quit in 2022, history of bladder cancer resolved 2020 and prostate cancer presently on Zytiga, prednisone  and Lupron after he completed radiation therapy, chronic thrombocytopenia referred to me for evaluation of atrial fibrillation.  Patient has a diagnosis of paroxysmal atrial fibrillation and has been on low-dose flecainide  for a long time.  He has no documented atrial fibrillation recently.  After extensive evaluation, in 2016 he had an episode of brief atrial fibrillation and spontaneously converted to sinus rhythm when he presented to [facility] emergency room.  Since then he has been on flecainide  and was being followed by Dr. Alverna Lamar Sieving and anticoagulation was not started due to low CHA2DS2-VASc rescore of 2 and has had multiple outpatient event monitors that did not reveal any atrial fibrillation episodes.  And he was started on flecainide  on 10/16/2015.  He is referred to me for evaluation of coronary calcification noted on CT chest.    Discussed the use of AI scribe software for clinical note transcription with the patient, who gave verbal consent to proceed.  History of Present Illness Andre Friedman is a 78 year old male with hypertension and coronary artery disease who presents with concerns about severe coronary artery calcification. He was referred by his primary care physician for evaluation of severe coronary artery calcification.  He is concerned about severe coronary artery calcification noted on a recent CT scan, which had progressed from prior nonsevere calcification. He walks his dog four  times a day without chest pain, dyspnea, or exertional limitation.  He has atrial fibrillation treated with flecainide  since December 2016. He can usually sense episodes but has not had documented atrial fibrillation since 2016. He has occasional brief palpitations described as a fluttery feeling. Ambulatory monitoring in August and April showed no atrial fibrillation.  He was diagnosed with prostate cancer last year and completed radiation therapy a month ago. He is on hormone therapy with Zytiga, prednisone , and Lupron, and notes muscle atrophy but remains active with regular walking.  He has hypertension treated with carvedilol  6.25 mg twice daily and ramipril  10 mg daily. He checks his blood pressure at home, with generally controlled readings and a recent value of 149/76.  He has high cholesterol and was previously on atorvastatin  40 mg, which was stopped. He is now on ezetimibe  10 mg daily. His LDL was 41 before atorvastatin  was discontinued.  He quit smoking two years ago and occasionally vapes. He smoked less than half a pack per day previously.  He had dizziness with a fall in August after vaping, which concerned him, but he has not had recurrent dizziness or falls since.  Cardiac Studies relevent.     CT Chest  09/25/2024: Heart/vessels: Normal heart size. No pericardial effusion.  There is severe coronary artery calcification.  Aorta is normal in caliber.  No atherosclerotic calcification.   Echocardiogram 06/13/2024: Normal LV systolic function, EF 60 to 65%.  No significant valvular abnormality.  Event monitor 11 days starting 06/13/2024 for PAF: Predominant underlying rhythm was sinus rhythm.  First-degree AV block was present.  Minimum heart rate 50 bpm, maximum heart rate is 107  bpm with average heart rate of 70 bpm. There were 3 SVT episodes, longest 6 beats, brief atrial tachycardia. Occasional PACs and PVCs. There was no atrial fibrillation, no patient activated  events. No significant change from prior event monitor 02/02/2024.  Labs   Care everywhere/Faxed External Labs:  Labs 08/30/2024:  Total cholesterol 114, triglycerides 206, HDL 36, LDL 41, non-HDL cholesterol 68.  Sodium 141, potassium 3.8, BUN 17, creatinine 1.0, eGFR 78 mL.  Hb 13.5/HCT 38.7, platelets 131.  TSH normal at 2.488.  ROS  Review of Systems  Cardiovascular:  Negative for chest pain, dyspnea on exertion and leg swelling.   Physical Exam:   VS:  BP (!) 149/76 (BP Location: Left Arm, Patient Position: Sitting, Cuff Size: Normal)   Pulse 66   Resp 16   Ht 5' 6 (1.676 m)   Wt 153 lb 3.2 oz (69.5 kg)   SpO2 97%   BMI 24.73 kg/m    Wt Readings from Last 3 Encounters:  10/02/24 153 lb 3.2 oz (69.5 kg)  06/29/20 170 lb (77.1 kg)  10/06/18 158 lb (71.7 kg)    BP Readings from Last 3 Encounters:  10/02/24 (!) 149/76  05/25/22 119/79  07/05/20 121/81   Physical Exam Neck:     Vascular: No carotid bruit or JVD.  Cardiovascular:     Rate and Rhythm: Normal rate and regular rhythm.     Pulses: Intact distal pulses.     Heart sounds: Normal heart sounds. No murmur heard.    No gallop.  Pulmonary:     Effort: Pulmonary effort is normal.     Breath sounds: Normal breath sounds.  Abdominal:     General: Bowel sounds are normal.     Palpations: Abdomen is soft.  Musculoskeletal:     Right lower leg: No edema.     Left lower leg: No edema.    EKG:    EKG Interpretation Date/Time:  Monday October 02 2024 09:14:36 EST Ventricular Rate:  66 PR Interval:  262 QRS Duration:  90 QT Interval:  412 QTC Calculation: 431 R Axis:   11  Text Interpretation: EKG 10/02/2024: Sinus rhythm first-degree block at rate of 66 bpm.  Compared to 06/29/2020, first-degree AV block Confirmed by Aryona Sill, Jagadeesh [phone]) on 10/02/2024 9:34:00 AM    ASSESSMENT AND PLAN: .      ICD-10-CM   1. Coronary artery disease due to calcified coronary lesion  I25.10 EKG 12-Lead   I25.84  ezetimibe  (ZETIA ) 10 MG tablet    Cardiac Stress Test: Informed Consent Details: Physician/Practitioner Attestation; Transcribe to consent form and obtain patient signature    MYOCARDIAL PERFUSION IMAGING    2. History of atrial fibrillation  Z86.79     3. Primary hypertension  I10 olmesartan -hydrochlorothiazide (BENICAR  HCT) 40-25 MG tablet    Basic metabolic panel with GFR    Basic metabolic panel with GFR    4. Hypercholesteremia  E78.00 ezetimibe  (ZETIA ) 10 MG tablet    5. Type 2 diabetes mellitus without complication, without long-term current use of insulin  (HCC)  E11.9 Cardiac Stress Test: Informed Consent Details: Physician/Practitioner Attestation; Transcribe to consent form and obtain patient signature     Assessment & Plan Coronary artery disease due to severe coronary calcification Severe coronary artery calcification identified on recent CT scan. Asymptomatic with no chest pain or dyspnea. Current medications include ramipril  and carvedilol , effectively managing risk factors. No need for invasive procedures like stenting or bypass surgery due to lack of symptoms. Stress  test discussed to assess severity, but no immediate intervention required unless symptoms develop. - Ordered stress test to assess coronary artery disease severity - Continue current medications: ramipril , carvedilol  - Educated on maintaining risk factor control to prevent progression  Primary hypertension Blood pressure generally well-controlled with occasional elevations. Current reading is 149/76 mmHg. Target blood pressure is less than 130/80 mmHg. Current regimen includes ramipril  and losartan, but losartan is not recommended with ramipril . Plan to switch to olmesartan HCT 40/25 mg in the morning for better control. - Discontinued losartan - Initiated olmesartan HCT 40/25 mg daily - Monitor blood pressure regularly, BMP in 2 to 3 weeks - Educated on importance of maintaining target blood  pressure  Hypercholesterolemia Previously well-controlled with atorvastatin  and ezetimibe . Ezetimibe  was discontinued by primary care provider. LDL cholesterol is well-controlled at 41 mg/dL. Plan to restart ezetimibe  to maintain cholesterol control. - Restarted ezetimibe  10 mg daily as he is tolerating the medication without side effects - Sent prescription to [facility] for 90 days with refills - Educated on importance of cholesterol management  Type 2 diabetes mellitus Type 2 diabetes mellitus, currently managed without specific mention of medications or lifestyle modifications in this encounter.  History of atrial fibrillation with brief atrial tachycardia Atrial fibrillation since 2016, currently well-controlled with flecainide . Occasional brief atrial tachycardia episodes noted, not concerning. No recent AFib episodes documented on monitoring. - Continue flecainide  50 mg BID along with carvedilol  6.25 mg twice daily.  Patient would like to continue present medical regiment.   Follow up: 3 months for follow-up of CAD, nuclear stress test and hypertension.  Encounter time was 55 minutes with review of his external records, extensive discussion regarding his rhythm management, high risk medication use and coordination of care.   Signed,  Gordy Bergamo, MD, [facility] 10/02/2024, 5:41 PM [facility] [address] [place], [place] [address] Phone: [phone]. Fax:  [phone]

## 2024-10-02 NOTE — Patient Instructions (Signed)
 Medication Instructions:  Restart Zetia  10 mg once daily Start Olmesartan-hydrochlorothiazide 40-25 mg once daily *If you need a refill on your cardiac medications before your next appointment, please call your pharmacy*  Lab Work: BMP in 2 weeks If you have labs (blood work) drawn today and your tests are completely normal, you will receive your results only by: MyChart Message (if you have MyChart) OR A paper copy in the mail If you have any lab test that is abnormal or we need to change your treatment, we will call you to review the results.  Testing/Procedures: Exercise Nuclear Stress Test  Follow-Up: At [facility], you and your health needs are our priority.  As part of our continuing mission to provide you with exceptional heart care, our providers are all part of one team.  This team includes your primary Cardiologist (physician) and Advanced Practice Providers or APPs (Physician Assistants and Nurse Practitioners) who all work together to provide you with the care you need, when you need it.  Your next appointment:   2-3 months  Provider:   Ladona, MD  We recommend signing up for the patient portal called MyChart.  Sign up information is provided on this After Visit Summary.  MyChart is used to connect with patients for Virtual Visits (Telemedicine).  Patients are able to view lab/test results, encounter notes, upcoming appointments, etc.  Non-urgent messages can be sent to your provider as well.   To learn more about what you can do with MyChart, go to [email or web address].

## 2024-10-03 ENCOUNTER — Telehealth (HOSPITAL_COMMUNITY): Payer: Self-pay | Admitting: *Deleted

## 2024-10-03 NOTE — Telephone Encounter (Signed)
 Pt's phone number not working. Left generic message on wife's phone to call anyone in dept. For 10/05/24 appointment instructions.

## 2024-10-04 ENCOUNTER — Other Ambulatory Visit: Payer: Self-pay | Admitting: Cardiology

## 2024-10-04 DIAGNOSIS — I2510 Atherosclerotic heart disease of native coronary artery without angina pectoris: Secondary | ICD-10-CM

## 2024-10-05 ENCOUNTER — Ambulatory Visit (HOSPITAL_COMMUNITY): Admission: RE | Admit: 2024-10-05 | Attending: Cardiology | Admitting: Cardiology

## 2024-10-11 ENCOUNTER — Emergency Department (HOSPITAL_COMMUNITY)
Admission: EM | Admit: 2024-10-11 | Discharge: 2024-10-12 | Disposition: A | Discharge status: Home / Self Care | Attending: Emergency Medicine | Admitting: Emergency Medicine

## 2024-10-11 ENCOUNTER — Telehealth (HOSPITAL_COMMUNITY): Payer: Self-pay | Admitting: *Deleted

## 2024-10-11 ENCOUNTER — Encounter (HOSPITAL_COMMUNITY): Payer: Self-pay

## 2024-10-11 ENCOUNTER — Other Ambulatory Visit: Payer: Self-pay

## 2024-10-11 DIAGNOSIS — E876 Hypokalemia: Secondary | ICD-10-CM | POA: Insufficient documentation

## 2024-10-11 DIAGNOSIS — Z7982 Long term (current) use of aspirin: Secondary | ICD-10-CM | POA: Insufficient documentation

## 2024-10-11 DIAGNOSIS — R55 Syncope and collapse: Secondary | ICD-10-CM | POA: Insufficient documentation

## 2024-10-11 LAB — COMPREHENSIVE METABOLIC PANEL WITH GFR
ALT: 16 U/L (ref 0–44)
AST: 18 U/L (ref 15–41)
Albumin: 4 g/dL (ref 3.5–5.0)
Alkaline Phosphatase: 92 U/L (ref 38–126)
Anion gap: 11 (ref 5–15)
BUN: 32 mg/dL — ABNORMAL HIGH (ref 8–23)
CO2: 26 mmol/L (ref 22–32)
Calcium: 9.7 mg/dL (ref 8.9–10.3)
Chloride: 104 mmol/L (ref 98–111)
Creatinine, Ser: 1.28 mg/dL — ABNORMAL HIGH (ref 0.61–1.24)
GFR, Estimated: 58 mL/min — ABNORMAL LOW (ref >60)
Glucose, Bld: 122 mg/dL — ABNORMAL HIGH (ref 70–99)
Potassium: 3.1 mmol/L — ABNORMAL LOW (ref 3.5–5.1)
Sodium: 140 mmol/L (ref 135–145)
Total Bilirubin: 0.9 mg/dL (ref 0.0–1.2)
Total Protein: 6.2 g/dL — ABNORMAL LOW (ref 6.5–8.1)

## 2024-10-11 LAB — CBC WITH DIFFERENTIAL/PLATELET
Abs Immature Granulocytes: 0.03 K/uL (ref 0.00–0.07)
Basophils Absolute: 0 K/uL (ref 0.0–0.1)
Basophils Relative: 0 %
Eosinophils Absolute: 0.1 K/uL (ref 0.0–0.5)
Eosinophils Relative: 1 %
HCT: 40 % (ref 39.0–52.0)
Hemoglobin: 13.8 g/dL (ref 13.0–17.0)
Immature Granulocytes: 0 %
Lymphocytes Relative: 10 %
Lymphs Abs: 1 K/uL (ref 0.7–4.0)
MCH: 29.9 pg (ref 26.0–34.0)
MCHC: 34.5 g/dL (ref 30.0–36.0)
MCV: 86.6 fL (ref 80.0–100.0)
Monocytes Absolute: 0.7 K/uL (ref 0.1–1.0)
Monocytes Relative: 7 %
Neutro Abs: 8.1 K/uL — ABNORMAL HIGH (ref 1.7–7.7)
Neutrophils Relative %: 82 %
Platelets: 126 K/uL — ABNORMAL LOW (ref 150–400)
RBC: 4.62 MIL/uL (ref 4.22–5.81)
RDW: 12.3 % (ref 11.5–15.5)
WBC: 10 K/uL (ref 4.0–10.5)
nRBC: 0 % (ref 0.0–0.2)

## 2024-10-11 LAB — URINALYSIS, ROUTINE W REFLEX MICROSCOPIC
Bilirubin Urine: NEGATIVE
Glucose, UA: NEGATIVE mg/dL
Hgb urine dipstick: NEGATIVE
Ketones, ur: NEGATIVE mg/dL
Leukocytes,Ua: NEGATIVE
Nitrite: NEGATIVE
Protein, ur: NEGATIVE mg/dL
Specific Gravity, Urine: 1.011 (ref 1.005–1.030)
pH: 5 (ref 5.0–8.0)

## 2024-10-11 MED ORDER — POTASSIUM CHLORIDE 10 MEQ/100ML IV SOLN
10.0000 meq | Freq: Once | INTRAVENOUS | Status: AC
  Administered 2024-10-11: 10 meq via INTRAVENOUS
  Filled 2024-10-11: qty 100

## 2024-10-11 MED ORDER — SODIUM CHLORIDE 0.9 % IV SOLN: Freq: Once | INTRAVENOUS | Status: AC

## 2024-10-11 MED ORDER — SODIUM CHLORIDE 0.9 % IV BOLUS
500.0000 mL | Freq: Once | INTRAVENOUS | Status: AC
  Administered 2024-10-11: 500 mL via INTRAVENOUS

## 2024-10-11 NOTE — ED Provider Notes (Signed)
 [facility] AT [facility] Provider Note   CSN: [id number] Arrival date & time: 10/11/24  8251     Patient presents with: No chief complaint on file.   Andre Friedman is a 78 y.o. male.   Patient to ED for evaluation of near syncopal episode this afternoon. He was sitting, had the urge to have a bowel movement and got up. While walking to the bathroom, he became lightheaded and fell forward, grabbing a nearby stool before falling to the ground. He states things went black for a brief second. No chest pain, SOb, recent fever or illness. No nausea or vomiting. He states he hasn't had anything to drink today but continues to urinate. Last HTN medication change was 12/1 by Dr. Ladona, however, patient reports history of similar episodes x 3 with first one in August.   The history is provided by the patient. No language interpreter was used.       Prior to Admission medications   Medication Sig Start Date End Date Taking? Authorizing Provider  abiraterone acetate (ZYTIGA) 250 MG tablet Take 1,000 mg by mouth daily. 10/21/23 10/20/24  [provider]  aspirin  EC 81 MG tablet Take 162 mg by mouth at bedtime.    [provider]  atorvastatin  (LIPITOR) 40 MG tablet Take 40 mg by mouth at bedtime.  09/24/15   [provider]  Calcium  Carb-Cholecalciferol 500-3.125 MG-MCG TABS Take 1 tablet by mouth daily.    [provider]  carvedilol  (COREG ) 6.25 MG tablet Take 6.25 mg by mouth 2 (two) times daily. 05/25/20   [provider]  chlorpheniramine-HYDROcodone (TUSSIONEX) 10-8 MG/5ML SUER Take 5 mLs by mouth every 12 (twelve) hours as needed for cough (If guaiFENesin  is ineffective). 07/05/20   Krishnan, Gokul, MD  dorzolamide  (TRUSOPT ) 2 % ophthalmic solution Place 1 drop into both eyes 2 (two) times daily. 04/28/20   [provider]  ezetimibe  (ZETIA ) 10 MG tablet Take 1 tablet (10 mg total) by mouth daily. 10/02/24  12/31/24  Ladona Heinz, MD  flecainide  (TAMBOCOR ) 50 MG tablet Take 50 mg by mouth 2 (two) times daily.  02/18/17   [provider]  fluticasone (FLONASE) 50 MCG/ACT nasal spray Place 2 sprays into the nose daily. 09/25/24   [provider]  glipiZIDE  (GLUCOTROL  XL) 5 MG 24 hr tablet Take 5 mg by mouth daily. 05/02/20   [provider]  latanoprost  (XALATAN ) 0.005 % ophthalmic solution Place 1 drop into both eyes at bedtime. 01/15/20   [provider]  metFORMIN (GLUCOPHAGE) 500 MG tablet Take 250 mg by mouth daily. 04/12/20   [provider]  olmesartan -hydrochlorothiazide (BENICAR  HCT) 40-25 MG tablet Take 1 tablet by mouth every morning. 10/02/24   Ladona Heinz, MD  oxybutynin (DITROPAN XL) 15 MG 24 hr tablet Take 15 mg by mouth daily. 06/05/24   [provider]  oxybutynin (DITROPAN) 5 MG tablet Take 5 mg by mouth every evening. 09/18/24   [provider]  predniSONE  (DELTASONE ) 20 MG tablet Take 3 tablets once daily for 3 days followed by 2 tablets once daily for 3 days followed by 1 tablet once daily for 3 days and then stop 07/05/20   Krishnan, Gokul, MD  promethazine-dextromethorphan (PROMETHAZINE-DM) 6.25-15 MG/5ML syrup Take 5 mLs by mouth 4 (four) times daily as needed. 09/25/24   [provider]  tamsulosin (FLOMAX) 0.4 MG CAPS capsule Take 0.4 mg by mouth daily. 08/15/24 11/13/24  [provider]  Allergies: Penicillins    Review of Systems  Updated Vital Signs BP (!) 96/56   Pulse 78   Temp 98 F (36.7 C)   Resp 15   SpO2 99%   Physical Exam Vitals and nursing note reviewed.  Constitutional:      Appearance: He is well-developed.  HENT:     Head: Normocephalic.     Mouth/Throat:     Mouth: Mucous membranes are dry.  Cardiovascular:     Rate and Rhythm: Normal rate and regular rhythm.     Heart sounds: No murmur heard. Pulmonary:     Effort: Pulmonary effort is normal.     Breath sounds: Normal  breath sounds. No wheezing, rhonchi or rales.  Abdominal:     General: Bowel sounds are normal.     Palpations: Abdomen is soft.     Tenderness: There is no abdominal tenderness. There is no guarding or rebound.  Musculoskeletal:        General: Normal range of motion.     Cervical back: Normal range of motion and neck supple.     Right lower leg: No edema.     Left lower leg: No edema.  Skin:    General: Skin is warm and dry.  Neurological:     General: No focal deficit present.     Mental Status: He is alert and oriented to person, place, and time.     (all labs ordered are listed, but only abnormal results are displayed) Labs Reviewed  CBC WITH DIFFERENTIAL/PLATELET - Abnormal; Notable for the following components:      Result Value   Platelets 126 (*)    Neutro Abs 8.1 (*)    All other components within normal limits  COMPREHENSIVE METABOLIC PANEL WITH GFR - Abnormal; Notable for the following components:   Potassium 3.1 (*)    Glucose, Bld 122 (*)    BUN 32 (*)    Creatinine, Ser 1.28 (*)    Total Protein 6.2 (*)    GFR, Estimated 58 (*)    All other components within normal limits  URINALYSIS, ROUTINE W REFLEX MICROSCOPIC    EKG: EKG Interpretation Date/Time:  Wednesday October 11 2024 18:08:14 EST Ventricular Rate:  70 PR Interval:  233 QRS Duration:  84 QT Interval:  433 QTC Calculation: 468 R Axis:   66  Text Interpretation: Sinus rhythm Prolonged PR interval Confirmed by Patsey Lot [phone]) on 10/11/2024 9:26:13 PM  Radiology: No results found.   Procedures   Medications Ordered in the ED  potassium chloride  10 mEq in 100 mL IVPB (10 mEq Intravenous New Bag/Given 10/11/24 2205)  sodium chloride  0.9 % bolus 500 mL (0 mLs Intravenous Stopped 10/11/24 2112)  sodium chloride  0.9 % bolus 500 mL (0 mLs Intravenous Stopped 10/11/24 2206)  0.9 %  sodium chloride  infusion ( Intravenous New Bag/Given 10/11/24 2203)    Clinical Course as of 10/11/24  2228  Wed Oct 11, 2024  1926 Patient found to be hypotensive in ED. No tachycardia. Labs, fluids pending.  [SU]  2222 He is receiving fluids. He reports he hasn't had anything, as in water, to drink since this morning.   500 cc fluids given with some improvement to BP. 2nd 500 cc bag running. The patient reports he does not want to stay in the hospital. He is scheduled for a stress test tomorrow with Dr. Ladona.   Potassium 3.1 - supplementation ordered.   Patient care signed out to Andre Camp, PA-C, pending  reassessment after fluids completed.  [SU]    Clinical Course User Index [SU] Odell Balls, PA-C                                 Medical Decision Making Amount and/or Complexity of Data Reviewed Labs: ordered.  Risk Prescription drug management.        Final diagnoses:  Near syncope    ED Discharge Orders     None          Odell Balls RIGGERS 10/11/24 2228    Patsey Lot, MD 10/11/24 [phone]

## 2024-10-11 NOTE — Telephone Encounter (Signed)
 Left message on voicemail in reference to upcoming appointment scheduled for 10/12/24 Phone number given for a call back so details instructions can be given. Claudene Ronal Quale, RN

## 2024-10-11 NOTE — ED Triage Notes (Signed)
 [facility] from home, got dizzy at home, denies LOC or injuries, grandson helped him back up.  BP 92/60 improved to 122/70 HR 70's CBG 124

## 2024-10-11 NOTE — Discharge Instructions (Signed)
 Please follow-up with your cardiologist tomorrow as scheduled for stress test further management and evaluation of your recurrent near passing out episodes.  Please stay hydrated.

## 2024-10-11 NOTE — Telephone Encounter (Signed)
 Patient given detailed instructions per Myocardial Perfusion Study Information Sheet for the test on 10/12/24 Patient notified to arrive 15 minutes early and that it is imperative to arrive on time for appointment to keep from having the test rescheduled.  If you need to cancel or reschedule your appointment, please call the office within 24 hours of your appointment. . Patient verbalized understanding.Andre Friedman

## 2024-10-11 NOTE — ED Provider Notes (Signed)
 Patient received initial IV fluid and blood pressure improved and patient requested to be discharged home. He does have an appointment with his cardiologist for stress test tomorrow.  He was then to return if he had any concern.  BP 108/78   Pulse 73   Temp 98 F (36.7 C)   Resp 16   SpO2 98%   Results for orders placed or performed during the hospital encounter of 10/11/24  CBC with Differential   Collection Time: 10/11/24  7:35 PM  Result Value Ref Range   WBC 10.0 4.0 - 10.5 K/uL   RBC 4.62 4.22 - 5.81 MIL/uL   Hemoglobin 13.8 13.0 - 17.0 g/dL   HCT 59.9 60.9 - 47.9 %   MCV 86.6 80.0 - 100.0 fL   MCH 29.9 26.0 - 34.0 pg   MCHC 34.5 30.0 - 36.0 g/dL   RDW 87.6 88.4 - 84.4 %   Platelets 126 (L) 150 - 400 K/uL   nRBC 0.0 0.0 - 0.2 %   Neutrophils Relative % 82 %   Neutro Abs 8.1 (H) 1.7 - 7.7 K/uL   Lymphocytes Relative 10 %   Lymphs Abs 1.0 0.7 - 4.0 K/uL   Monocytes Relative 7 %   Monocytes Absolute 0.7 0.1 - 1.0 K/uL   Eosinophils Relative 1 %   Eosinophils Absolute 0.1 0.0 - 0.5 K/uL   Basophils Relative 0 %   Basophils Absolute 0.0 0.0 - 0.1 K/uL   Immature Granulocytes 0 %   Abs Immature Granulocytes 0.03 0.00 - 0.07 K/uL  Comprehensive metabolic panel   Collection Time: 10/11/24  7:35 PM  Result Value Ref Range   Sodium 140 135 - 145 mmol/L   Potassium 3.1 (L) 3.5 - 5.1 mmol/L   Chloride 104 98 - 111 mmol/L   CO2 26 22 - 32 mmol/L   Glucose, Bld 122 (H) 70 - 99 mg/dL   BUN 32 (H) 8 - 23 mg/dL   Creatinine, Ser 8.71 (H) 0.61 - 1.24 mg/dL   Calcium  9.7 8.9 - 10.3 mg/dL   Total Protein 6.2 (L) 6.5 - 8.1 g/dL   Albumin 4.0 3.5 - 5.0 g/dL   AST 18 15 - 41 U/L   ALT 16 0 - 44 U/L   Alkaline Phosphatase 92 38 - 126 U/L   Total Bilirubin 0.9 0.0 - 1.2 mg/dL   GFR, Estimated 58 (L) >60 mL/min   Anion gap 11 5 - 15  Urinalysis, Routine w reflex microscopic -Urine, Clean Catch   Collection Time: 10/11/24  7:35 PM  Result Value Ref Range   Color, Urine YELLOW  YELLOW   APPearance CLEAR CLEAR   Specific Gravity, Urine 1.011 1.005 - 1.030   pH 5.0 5.0 - 8.0   Glucose, UA NEGATIVE NEGATIVE mg/dL   Hgb urine dipstick NEGATIVE NEGATIVE   Bilirubin Urine NEGATIVE NEGATIVE   Ketones, ur NEGATIVE NEGATIVE mg/dL   Protein, ur NEGATIVE NEGATIVE mg/dL   Nitrite NEGATIVE NEGATIVE   Leukocytes,Ua NEGATIVE NEGATIVE   No results found.    Nivia Colon, PA-C 10/11/24 2336    Patsey Lot, MD 10/12/24 1444

## 2024-10-12 ENCOUNTER — Ambulatory Visit (HOSPITAL_COMMUNITY)
Admission: RE | Admit: 2024-10-12 | Discharge: 2024-10-12 | Disposition: A | Discharge status: Home / Self Care | Source: Ambulatory Visit | Attending: Internal Medicine | Admitting: Internal Medicine

## 2024-10-12 ENCOUNTER — Ambulatory Visit: Payer: Self-pay | Admitting: Cardiology

## 2024-10-12 DIAGNOSIS — I2584 Coronary atherosclerosis due to calcified coronary lesion: Secondary | ICD-10-CM

## 2024-10-12 DIAGNOSIS — I2510 Atherosclerotic heart disease of native coronary artery without angina pectoris: Secondary | ICD-10-CM

## 2024-10-12 LAB — MYOCARDIAL PERFUSION IMAGING
Angina Index: 0
Base ST Depression (mm): 0 mm
Estimated workload: 7.4
Exercise duration (min): 6 min
Exercise duration (sec): 45 s
LV dias vol: 53 mL (ref 62–150)
LV sys vol: 15 mL (ref 4.2–5.8)
MPHR: 144 {beats}/min
Nuc Stress EF: 72 %
Peak HR: 127 {beats}/min
Percent HR: 88 %
RPE: 18
Rest HR: 67 {beats}/min
Rest Nuclear Isotope Dose: 10.2 mCi
SDS: 0
SRS: 6
SSS: 0
Stress Nuclear Isotope Dose: 31.3 mCi
TID: 0.79

## 2024-10-12 MED ORDER — TECHNETIUM TC 99M TETROFOSMIN IV KIT
31.3000 | PACK | Freq: Once | INTRAVENOUS | Status: AC | PRN
  Administered 2024-10-12: 31.3 via INTRAVENOUS

## 2024-10-12 MED ORDER — REGADENOSON 0.4 MG/5ML IV SOLN: 0.4000 mg | Freq: Once | INTRAVENOUS | Status: DC

## 2024-10-12 MED ORDER — TECHNETIUM TC 99M TETROFOSMIN IV KIT
10.2000 | PACK | Freq: Once | INTRAVENOUS | Status: AC | PRN
  Administered 2024-10-12: 10.2 via INTRAVENOUS

## 2024-10-12 NOTE — Progress Notes (Signed)
 Normal stress test with minor abnormality.

## 2024-10-16 ENCOUNTER — Telehealth: Payer: Self-pay | Admitting: Cardiology

## 2024-10-16 NOTE — Telephone Encounter (Signed)
 Patient asking if he still had to get ordered labs done as he was just in the hospital and has been stuck a lot. Labs requested by Dr Ladona were a BMP and pt had a CMP in hospital. Advised patient that he did not have to come get labs drawn at this time. Will forward along to Dr Ladona.

## 2024-10-16 NOTE — Telephone Encounter (Signed)
 Pt was in the hospital last week in which labs was drawn. Pt wanted to know if these labs are still accurate. Please advise

## 2024-10-17 ENCOUNTER — Ambulatory Visit: Payer: Self-pay | Admitting: Cardiology

## 2024-10-17 NOTE — Progress Notes (Signed)
 Your stress test as previously mentioned is normal. There are some non cardiac findings in the lung and liver that needs follow up and I have forwarded the results to your PCP and would want you to discuss and follow up with him  regarding further evaluation

## 2024-10-17 NOTE — Telephone Encounter (Signed)
 NO I do not want it

## 2024-11-16 ENCOUNTER — Telehealth: Payer: Self-pay | Admitting: Cardiology

## 2024-11-16 NOTE — Telephone Encounter (Signed)
 Pt c/o medication issue:  1. Name of Medication:  olmesartan -hydrochlorothiazide (BENICAR  HCT) 40-25 MG tablet  2. How are you currently taking this medication (dosage and times per day)?  As prescribed  3. Are you having a reaction (difficulty breathing--STAT)?   4. What is your medication issue?   Since taking the medication patient has been getting lightheaded/dizzy especially when walking. BP has been normal.

## 2024-11-16 NOTE — Telephone Encounter (Signed)
 Spoke to to patient.  Patient states since stating  Benicar  40 /12.5  mg. He is dizzy and lightheaded. He states it last all day.   Usually takes medication @ 9 am - 10 am.  He checked blood pressures today but it was right after taking medication  145/81 pulse  62.   He was not able to give an other readings. He states has ran 120-135/80's.. the range is not all  the time,    He states he is hydrated.  RN Informed patient to take blood pressure at least one hour after he takes medication for the day. He verbalized understanding,   Patient is aware will defer to Dr Ladona for review and response.

## 2024-12-06 ENCOUNTER — Ambulatory Visit: Admitting: Cardiology

## 2024-12-06 NOTE — Progress Notes (Unsigned)
 " Cardiology Office Note:  .   Date:  12/06/2024  ID:  Andre Friedman, DOB Mar 18, 1946, MRN [id number] PCP: Joshua Franky Ozell [name]  [facility] Providers Cardiologist:  None { Click to update primary MD,subspecialty MD or APP then REFRESH:1}  History of Present Illness: .   Andre Friedman is a 79 y.o. Caucasian male patient with primary hypertension, hypercholesterolemia, type 2 diabetes without complications, h/o tobacco use disorder quit in 2022, history of bladder cancer resolved 2020 and prostate cancer presently on Zytiga, prednisone  and Lupron after he completed radiation therapy, chronic thrombocytopenia. Patient has a diagnosis of paroxysmal atrial fibrillation and has been on low-dose flecainide  since 2016 and was followed elsewhere until 10/02/24.  He has no documented atrial fibrillation recently.   Patient was seen on 10/11/2024 with an episode of syncope while he was sitting in the living room had an urge to defecate, while walking to the bathroom felt lightheaded and fell forward to the ground he admitted to having similar episodes x 3, first episode in August 2025.  His potassium level was low on admission to the ED.    Discussed the use of AI scribe software for clinical note transcription with the patient, who gave verbal consent to proceed.  History of Present Illness   Cardiac Studies relevent.    MYOCARDIAL PERFUSION IMAGING 10/12/2024  Patient exercised for 6 minutes and 45 seconds and achieved 7.4 METS.  Negative for ischemia.  No inducible arrhythmias. Normal perfusion without ischemia, EF 72%.  Coronary calcification evident.  Echocardiogram 06/13/2024: Normal LV systolic function, EF 60 to 65%.  No significant valvular abnormality.  EKG:      Labs   Recent Labs    10/11/24 1935  NA 140  K 3.1*  CL 104  CO2 26  GLUCOSE 122*  BUN 32*  CREATININE 1.28*  CALCIUM  9.7  GFRNONAA 58*    Lab Results  Component Value Date   ALT 16 10/11/2024   AST  18 10/11/2024   ALKPHOS 92 10/11/2024   BILITOT 0.9 10/11/2024      Latest Ref Rng & Units 10/11/2024    7:35 PM 07/05/2020    1:45 PM 07/04/2020    1:32 PM  CBC  WBC 4.0 - 10.5 K/uL 10.0  8.6  9.7   Hemoglobin 13.0 - 17.0 g/dL 86.1  85.9  87.5   Hematocrit 39.0 - 52.0 % 40.0  42.5  37.6   Platelets 150 - 400 K/uL 126  165  186    Lab Results  Component Value Date   HGBA1C 6.7 (H) 06/30/2020    No results found for: TSH  Care everywhere/Faxed External Labs:  Labs 08/30/2024:   Total cholesterol 114, triglycerides 206, HDL 36, LDL 41, non-HDL cholesterol 68.  TSH normal at 2.488.   ROS  ***ROS Physical Exam:   VS:  There were no vitals taken for this visit.   Wt Readings from Last 3 Encounters:  10/02/24 153 lb 3.2 oz (69.5 kg)  06/29/20 170 lb (77.1 kg)  10/06/18 158 lb (71.7 kg)    BP Readings from Last 3 Encounters:  10/11/24 108/78  10/02/24 (!) 149/76  05/25/22 119/79   ***Physical Exam  ASSESSMENT AND PLAN: .      ICD-10-CM   1. Vasovagal syncope  R55     2. History of atrial fibrillation  Z86.79     3. Primary hypertension  I10      Assessment & Plan   Follow  up: ***  Signed,  Gordy Bergamo, MD, [facility] 12/06/2024, 7:07 AM [facility] [address] [place], [place] [address] Phone: [phone]. Fax:  [phone]  "

## 2024-12-08 ENCOUNTER — Ambulatory Visit: Admitting: Cardiology

## 2024-12-08 VITALS — BP 110/67 | HR 67 | Ht 66.5 in | Wt 155.0 lb

## 2024-12-08 DIAGNOSIS — Z8679 Personal history of other diseases of the circulatory system: Secondary | ICD-10-CM

## 2024-12-08 DIAGNOSIS — R55 Syncope and collapse: Secondary | ICD-10-CM

## 2024-12-08 DIAGNOSIS — I10 Essential (primary) hypertension: Secondary | ICD-10-CM

## 2024-12-08 DIAGNOSIS — E7800 Pure hypercholesterolemia, unspecified: Secondary | ICD-10-CM

## 2024-12-08 MED ORDER — OLMESARTAN MEDOXOMIL 20 MG PO TABS: 20.0000 mg | ORAL_TABLET | Freq: Every evening | ORAL | 3 refills | Status: AC

## 2024-12-08 NOTE — Patient Instructions (Signed)
 Medication Instructions:  Meds ordered this encounter  Medications   olmesartan  (BENICAR ) 20 MG tablet    Sig: Take 1 tablet (20 mg total) by mouth every evening.    Dispense:  90 tablet    Refill:  3   Medications Discontinued During This Encounter  Medication Reason   promethazine-dextromethorphan (PROMETHAZINE-DM) 6.25-15 MG/5ML syrup    chlorpheniramine-HYDROcodone (TUSSIONEX) 10-8 MG/5ML SUER Patient Preference   fluticasone (FLONASE) 50 MCG/ACT nasal spray Patient Preference   olmesartan -hydrochlorothiazide (BENICAR  HCT) 40-25 MG tablet Change in therapy   *If you need a refill on your cardiac medications before your next appointment, please call your pharmacy*   Follow-Up: At [facility], you and your health needs are our priority.  As part of our continuing mission to provide you with exceptional heart care, our providers are all part of one team.  This team includes your primary Cardiologist (physician) and Advanced Practice Providers or APPs (Physician Assistants and Nurse Practitioners) who all work together to provide you with the care you need, when you need it.  Your next appointment:   1 year(s)  Provider:   Gordy Bergamo, MD       We recommend signing up for the patient portal called MyChart.  Patients are able to view lab/test results, encounter notes, upcoming appointments, etc.  Non-urgent messages can be sent to your provider as well, go to [email or web address].

## 2024-12-08 NOTE — Progress Notes (Unsigned)
 " Cardiology Office Note:  .   Date:  12/08/2024  ID:  Andre Friedman, DOB 04-08-1946, MRN [id number] PCP: Andre Friedman  [facility] Providers Cardiologist:  None { Click to update primary MD,subspecialty MD or APP then REFRESH:1}  History of Present Illness: .   Andre Friedman is a 79 y.o. Caucasian male patient with primary hypertension, hypercholesterolemia, type 2 diabetes without complications, h/o tobacco use disorder quit in 2022, history of bladder cancer resolved 2020 and prostate cancer presently on Zytiga, prednisone  and Lupron after he completed radiation therapy, chronic thrombocytopenia. Patient has a diagnosis of paroxysmal atrial fibrillation and has been on low-dose flecainide  since 2016 and was followed elsewhere until 10/02/24.  He has no documented atrial fibrillation recently.   Patient was seen on 10/11/2024 with an episode of syncope while he was sitting in the living room had an urge to defecate, while walking to the bathroom felt lightheaded and fell forward to the ground he admitted to having similar episode in August 2025.  His potassium level was low on admission to the ED.    Discussed the use of AI scribe software for clinical note transcription with the patient, who gave verbal consent to proceed.  History of Present Illness   Cardiac Studies relevent.    MYOCARDIAL PERFUSION IMAGING 10/12/2024  Patient exercised for 6 minutes and 45 seconds and achieved 7.4 METS.  Negative for ischemia.  No inducible arrhythmias. Normal perfusion without ischemia, EF 72%.  Coronary calcification evident.  Echocardiogram 06/13/2024: Normal LV systolic function, EF 60 to 65%.  No significant valvular abnormality.  EKG:      Labs   Recent Labs    10/11/24 1935  NA 140  K 3.1*  CL 104  CO2 26  GLUCOSE 122*  BUN 32*  CREATININE 1.28*  CALCIUM  9.7  GFRNONAA 58*    Lab Results  Component Value Date   ALT 16 10/11/2024   AST 18 10/11/2024    ALKPHOS 92 10/11/2024   BILITOT 0.9 10/11/2024      Latest Ref Rng & Units 10/11/2024    7:35 PM 07/05/2020    1:45 PM 07/04/2020    1:32 PM  CBC  WBC 4.0 - 10.5 K/uL 10.0  8.6  9.7   Hemoglobin 13.0 - 17.0 g/dL 86.1  85.9  87.5   Hematocrit 39.0 - 52.0 % 40.0  42.5  37.6   Platelets 150 - 400 K/uL 126  165  186    Lab Results  Component Value Date   HGBA1C 6.7 (H) 06/30/2020    No results found for: TSH  Care everywhere/Faxed External Labs:  Labs 08/30/2024:   Total cholesterol 114, triglycerides 206, HDL 36, LDL 41, non-HDL cholesterol 68.  TSH normal at 2.488.   ROS  Review of Systems  Cardiovascular:  Positive for near-syncope and syncope. Negative for chest pain, dyspnea on exertion and leg swelling.   Physical Exam:   VS:  BP 110/67 (BP Location: Right Arm, Patient Position: Sitting, Cuff Size: Normal)   Pulse 67   Ht 5' 6.5 (1.689 m)   Wt 155 lb (70.3 kg)   SpO2 96%   BMI 24.64 kg/m    Wt Readings from Last 3 Encounters:  12/08/24 155 lb (70.3 kg)  10/02/24 153 lb 3.2 oz (69.5 kg)  06/29/20 170 lb (77.1 kg)    BP Readings from Last 3 Encounters:  12/08/24 110/67  10/11/24 108/78  10/02/24 (!) 149/76   Physical  Exam Neck:     Vascular: No carotid bruit or JVD.  Cardiovascular:     Rate and Rhythm: Normal rate and regular rhythm.     Pulses: Intact distal pulses.     Heart sounds: Normal heart sounds. No murmur heard.    No gallop.  Pulmonary:     Effort: Pulmonary effort is normal.     Breath sounds: Normal breath sounds.  Abdominal:     General: Bowel sounds are normal.     Palpations: Abdomen is soft.  Musculoskeletal:     Right lower leg: No edema.     Left lower leg: No edema.     ASSESSMENT AND PLAN: .      ICD-10-CM   1. Vasovagal syncope  R55     2. History of atrial fibrillation  Z86.79     3. Primary hypertension  I10 olmesartan  (BENICAR ) 20 MG tablet    4. Pure hypercholesterolemia  E78.00      Assessment &  Plan   Follow up: 1 Year for H/O A. Fib and on Flecainide , Hypertension  Signed,  Gordy Bergamo, MD, [facility] 12/08/2024, 3:14 PM [facility] [address] [place], [place] [address] Phone: [phone]. Fax:  [phone]  "
